# Patient Record
Sex: Female | Born: 1990 | Race: White | Hispanic: No | Smoking: Never smoker
Health system: Southern US, Community
[De-identification: ages and names within clinical notes are randomized; demographics above are authoritative.]

## PROBLEM LIST (undated history)

## (undated) DIAGNOSIS — N390 Urinary tract infection, site not specified: Secondary | ICD-10-CM

## (undated) DIAGNOSIS — F909 Attention-deficit hyperactivity disorder, unspecified type: Secondary | ICD-10-CM

## (undated) DIAGNOSIS — N189 Chronic kidney disease, unspecified: Secondary | ICD-10-CM

## (undated) DIAGNOSIS — N200 Calculus of kidney: Secondary | ICD-10-CM

## (undated) HISTORY — PX: WISDOM TOOTH EXTRACTION: SHX21

## (undated) HISTORY — DX: Urinary tract infection, site not specified: N39.0

## (undated) HISTORY — DX: Calculus of kidney: N20.0

## (undated) HISTORY — DX: Attention-deficit hyperactivity disorder, unspecified type: F90.9

## (undated) HISTORY — DX: Chronic kidney disease, unspecified: N18.9

## (undated) HISTORY — PX: OTHER SURGICAL HISTORY: SHX169

---

## 2012-07-14 ENCOUNTER — Emergency Department: Payer: Self-pay | Admitting: Emergency Medicine

## 2012-07-14 LAB — COMPREHENSIVE METABOLIC PANEL
Albumin: 4.5 g/dL (ref 3.4–5.0)
Alkaline Phosphatase: 63 U/L (ref 50–136)
Anion Gap: 8 (ref 7–16)
BUN: 11 mg/dL (ref 7–18)
Bilirubin,Total: 0.4 mg/dL (ref 0.2–1.0)
Calcium, Total: 8.9 mg/dL (ref 8.5–10.1)
Chloride: 108 mmol/L — ABNORMAL HIGH (ref 98–107)
Co2: 25 mmol/L (ref 21–32)
Creatinine: 0.75 mg/dL (ref 0.60–1.30)
EGFR (African American): >60
Osmolality: 281 (ref 275–301)
Potassium: 3.8 mmol/L (ref 3.5–5.1)
Sodium: 141 mmol/L (ref 136–145)
Total Protein: 7.9 g/dL (ref 6.4–8.2)

## 2012-07-14 LAB — URINALYSIS, COMPLETE
Bacteria: NONE SEEN
Ketone: NEGATIVE
Leukocyte Esterase: NEGATIVE
Nitrite: NEGATIVE
Ph: 6 (ref 4.5–8.0)
Protein: 30
Squamous Epithelial: 2

## 2012-07-14 LAB — DIFFERENTIAL
Basophil #: 0.1 10*3/uL (ref 0.0–0.1)
Basophil %: 0.4 %
Eosinophil #: 0 10*3/uL (ref 0.0–0.7)
Eosinophil %: 0.2 %
Lymphocyte #: 1 10*3/uL (ref 1.0–3.6)
Lymphocyte %: 6.8 %
Monocyte #: 0.4 x10 3/mm (ref 0.2–0.9)
Monocyte %: 2.8 %
Neutrophil #: 13.3 10*3/uL — ABNORMAL HIGH (ref 1.4–6.5)

## 2012-07-14 LAB — CBC
MCHC: 34.6 g/dL (ref 32.0–36.0)
MCV: 89 fL (ref 80–100)
Platelet: 190 10*3/uL (ref 150–440)
RBC: 4.2 10*6/uL (ref 3.80–5.20)
WBC: 14.8 10*3/uL — ABNORMAL HIGH (ref 3.6–11.0)

## 2012-07-14 LAB — LIPASE, BLOOD: Lipase: 83 U/L (ref 73–393)

## 2017-12-02 ENCOUNTER — Encounter: Payer: Self-pay | Admitting: Certified Nurse Midwife

## 2017-12-02 ENCOUNTER — Ambulatory Visit: Payer: BC Managed Care – PPO | Admitting: Certified Nurse Midwife

## 2017-12-02 VITALS — BP 92/55 | HR 51 | Ht 67.0 in | Wt 140.6 lb

## 2017-12-02 DIAGNOSIS — N3001 Acute cystitis with hematuria: Secondary | ICD-10-CM | POA: Diagnosis not present

## 2017-12-02 MED ORDER — NITROFURANTOIN MONOHYD MACRO 100 MG PO CAPS: 100.0000 mg | ORAL_CAPSULE | Freq: Two times a day (BID) | ORAL | 0 refills | Status: AC

## 2017-12-02 NOTE — Progress Notes (Signed)
Pt is here with c/o urgency, burning and freq. Was treated with MacroBid x5. Was using Cipro before the culture was back.

## 2017-12-02 NOTE — Progress Notes (Signed)
GYN ENCOUNTER NOTE  Subjective:       Susan Cooper is a 27 y.o. No obstetric history on file. female is here for gynecologic evaluation of the following issues:  1. UTI, she was treated [facility]TI. She states she gets them all the time after intercourse. She states she was started on Cipro then she was changed to Macrobid x 5 days.   She complains of frequency, urgency, and burning. She denies fever.     Obstetric History OB History  No data available    History reviewed. No pertinent past medical history.  History reviewed. No pertinent surgical history.  Current Outpatient Medications on File Prior to Visit  Medication Sig Dispense Refill  . Levonorgestrel (SKYLA) 13.5 MG IUD by Intrauterine route.     No current facility-administered medications on file prior to visit.     No Known Allergies  Social History   Socioeconomic History  . Marital status: Single    Spouse name: Not on file  . Number of children: Not on file  . Years of education: Not on file  . Highest education level: Not on file  Social Needs  . Financial resource strain: Not on file  . Food insecurity - worry: Not on file  . Food insecurity - inability: Not on file  . Transportation needs - medical: Not on file  . Transportation needs - non-medical: Not on file  Occupational History  . Not on file  Tobacco Use  . Smoking status: Not on file  Substance and Sexual Activity  . Alcohol use: Not on file  . Drug use: Not on file  . Sexual activity: Not on file  Other Topics Concern  . Not on file  Social History Narrative  . Not on file    History reviewed. No pertinent family history.  The following portions of the patient's history were reviewed and updated as appropriate: allergies, current medications, past family history, past medical history, past social history, past surgical history and problem list.  Review of Systems Review of Systems - Negative except as metioned  in HPI Review of Systems - General ROS: negative for - chills, fatigue, fever, hot flashes, malaise or night sweats Hematological and Lymphatic ROS: negative for - bleeding problems or swollen lymph nodes Gastrointestinal ROS: negative for - abdominal pain, blood in stools, change in bowel habits and nausea/vomiting Musculoskeletal ROS: negative for - joint pain, muscle pain or muscular weakness Genito-Urinary ROS: negative for - change in menstrual cycle, dysmenorrhea, dyspareunia, genital discharge, genital ulcers,  incontinence, irregular/heavy menses, nocturia or pelvic pain. Positive: dysuria, hematuria, frequency, burning and urgency.  Objective:   BP (!) 92/55   Pulse (!) 51   Ht 5\' 7"  (1.702 m)   Wt 140 lb 9 oz (63.8 kg)   LMP 11/22/2017 (Exact Date)   BMI 22.02 kg/m  Temp 98.0 CONSTITUTIONAL: Well-developed, well-nourished female in no acute distress.  HENT:  Normocephalic, atraumatic.  NECK: Normal range of motion, supple,  SKIN: Skin is warm and dry. No rash noted. Not diaphoretic. No erythema. No pallor. [place]: Alert and oriented to person, place, and time.  PSYCHIATRIC: Normal mood and affect. Normal behavior. Normal judgment and thought content. CARDIOVASCULAR:Not Examined RESPIRATORY:Clear bilaterally  BREASTS: Not Examined Kidneys: negative CVA tenderness ABDOMEN: Soft, non distended; Non tender.  No Organomegaly. PELVIC:not indicated MUSCULOSKELETAL: Normal range of motion. No tenderness.  No cyanosis, clubbing, or edema.   Assessment:   Urinary Frequency  Plan:   Urine culture, order for Macrobid. Encourage PO hydration, cranberry juice, AZO and good perineal hygiene. Encourage emptying bladder after intercourse. Discussed use of continuous antimicrobial prophylaxis regimens for women with recurrent urinary tract infection. She verbalizes understanding and agrees to plan.    Philip Aspen, CNM

## 2017-12-02 NOTE — Patient Instructions (Signed)
Acute Urinary Retention, Female Urinary retention means you are unable to pee completely or at all (empty your bladder). Follow these instructions at home:  Drink enough fluids to keep your pee (urine) clear or pale yellow.  If you are sent home with a tube that drains the bladder (catheter), there will be a drainage bag attached to it. There are two types of bags. One is big that you can wear at night without having to empty it. One is smaller and needs to be emptied more often.  Keep the drainage bag emptied.  Keep the drainage bag lower than the tube.  Only take medicine as told by your doctor. Contact a doctor if:  You have a low-grade fever.  You have spasms or you are leaking pee when you have spasms. Get help right away if:  You have chills or a fever.  Your catheter stops draining pee.  Your catheter falls out.  You have increased bleeding that does not stop after you have rested and increased the amount of fluids you had been drinking. This information is not intended to replace advice given to you by your health care provider. Make sure you discuss any questions you have with your health care provider. Document Released: 03/12/2008 Document Revised: 03/01/2016 Document Reviewed: 03/05/2013 Elsevier Interactive Patient Education  2017 Elsevier Inc.  

## 2017-12-04 ENCOUNTER — Telehealth: Payer: Self-pay

## 2017-12-04 ENCOUNTER — Other Ambulatory Visit: Payer: Self-pay | Admitting: Certified Nurse Midwife

## 2017-12-04 DIAGNOSIS — N390 Urinary tract infection, site not specified: Secondary | ICD-10-CM

## 2017-12-04 LAB — URINE CULTURE

## 2017-12-04 NOTE — Progress Notes (Signed)
Order placed to urology per pt request for recurrent UTI's  Philip Aspen, CNM

## 2017-12-04 NOTE — Telephone Encounter (Signed)
Message left with providers instructions.

## 2017-12-05 ENCOUNTER — Telehealth: Payer: Self-pay

## 2017-12-05 NOTE — Telephone Encounter (Signed)
Spoke with pt- she received the previous message and she has an appointment scheduled with urologist next week.

## 2017-12-11 ENCOUNTER — Ambulatory Visit: Payer: BC Managed Care – PPO | Admitting: Urology

## 2017-12-12 ENCOUNTER — Ambulatory Visit: Payer: BC Managed Care – PPO | Admitting: Urology

## 2017-12-12 ENCOUNTER — Encounter: Payer: Self-pay | Admitting: Urology

## 2017-12-12 VITALS — BP 103/69 | HR 58 | Ht 67.0 in | Wt 135.0 lb

## 2017-12-12 DIAGNOSIS — N390 Urinary tract infection, site not specified: Secondary | ICD-10-CM

## 2017-12-12 DIAGNOSIS — Z87442 Personal history of urinary calculi: Secondary | ICD-10-CM | POA: Diagnosis not present

## 2017-12-12 MED ORDER — CEPHALEXIN 250 MG PO CAPS: ORAL_CAPSULE | ORAL | 0 refills | Status: DC

## 2017-12-12 NOTE — Progress Notes (Signed)
12/12/2017 9:54 AM   Angus Seller 1990-12-21 [id number]  Referring provider: Philip Aspen, CNM [address] [place], [place] [address]  Chief Complaint  Patient presents with  . New Patient (Initial Visit)    HPI: Patient is a 27 -year-old Caucasian female who is referred to [place] by Philip Aspen, CNM, for recurrent urinary tract infections.  She states for the last 2-3 years she has been experiencing UTIs over and over again.    Reviewing her records,  she has had no documented positive urine cultures.  She generally seeks treatment through urgent care centers and they typically do not culture urine's.  Her symptoms with a urinary tract infection consist of dysuria and urgency.  She has been told from time to time that she had blood in her urine, but she denies any gross hematuria.  She has had noted some intermittent flank pain.  She denies gross hematuria, suprapubic pain, back pain, abdominal pain or flank pain associated with UTI's.  She has not had any recent fevers, chills, nausea or vomiting associated with UTI's.   She has a history of spontaneous passage of a stone.  She does not have a history of GU surgery or GU trauma.  She is sexually active.  She has noted a correlation with her urinary tract infections and sexual intercourse.   She does engage in anal sex.  She is not having anal to vaginal sex.  She is voiding before and after sex.   She denies constipation and/or diarrhea.  She does use tampons, but she is changing them regularly.  She does engage in good perineal hygiene. She does not take tub baths.   She does not have incontinence.  She is not having pain with bladder filling.    She has not had any recent imaging studies.    She mostly drinks coffee and energy drinks.  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    Her CATH UA today is negative.  Her PVR was minimal.    PMH: Past Medical History:  Diagnosis Date  . Chronic  kidney disease   . Kidney stone   . Urinary tract infection     Surgical History: No past surgical history on file.  Home Medications:  Allergies as of 12/12/2017   No Known Allergies     Medication List        Accurate as of 12/12/17 11:59 PM. Always use your most recent med list.          cephALEXin 250 MG capsule Commonly known as:  KEFLEX Take one capsule after intercourse   nitrofurantoin (macrocrystal-monohydrate) 100 MG capsule Commonly known as:  MACROBID Take 1 capsule (100 mg total) by mouth 2 (two) times daily for 10 days.   SKYLA 13.5 MG Iud Generic drug:  Levonorgestrel by Intrauterine route.       Allergies: No Known Allergies  Family History: Family History  Problem Relation Age of Onset  . Hematuria Paternal Grandmother   . Kidney failure Paternal Grandmother   . Kidney disease Paternal Grandfather   . Kidney cancer Neg Hx   . Prostate cancer Neg Hx     Social History:  reports that she has never smoked. She has never used smokeless tobacco. She reports that she drinks alcohol. She reports that she does not use drugs.  ROS: UROLOGY Frequent Urination?: Yes Hard to postpone urination?: Yes Burning/pain with urination?: Yes Get up at night to  urinate?: No Leakage of urine?: No Urine stream starts and stops?: No Trouble starting stream?: No Do you have to strain to urinate?: No Blood in urine?: Yes Urinary tract infection?: Yes Sexually transmitted disease?: No Injury to kidneys or bladder?: No Painful intercourse?: No Weak stream?: No Currently pregnant?: No Vaginal bleeding?: No Last menstrual period?: 11/22/17  Gastrointestinal Nausea?: No Vomiting?: No Indigestion/heartburn?: No Diarrhea?: No Constipation?: No  Constitutional Fever: No Night sweats?: No Weight loss?: No Fatigue?: No  Skin Skin rash/lesions?: No Itching?: No  Eyes Blurred vision?: No Double vision?: No  Ears/Nose/Throat Sore throat?: No Sinus  problems?: No  Hematologic/Lymphatic Swollen glands?: No Easy bruising?: No  Cardiovascular Leg swelling?: No Chest pain?: No  Respiratory Cough?: No Shortness of breath?: No  Endocrine Excessive thirst?: No  Musculoskeletal Back pain?: No Joint pain?: No  Neurological Headaches?: No Dizziness?: No  Psychologic Depression?: No Anxiety?: No  Physical Exam: BP 103/69   Pulse (!) 58   Ht 5' 7" (1.702 m)   Wt 135 lb (61.2 kg)   LMP 11/22/2017 (Exact Date)   BMI 21.14 kg/m   Constitutional: Well nourished. Alert and oriented, No acute distress. HEENT: [place] AT, moist mucus membranes. Trachea midline, no masses. Cardiovascular: No clubbing, cyanosis, or edema. Respiratory: Normal respiratory effort, no increased work of breathing. GI: Abdomen is soft, non tender, non distended, no abdominal masses. Liver and spleen not palpable.  No hernias appreciated.  Stool sample for occult testing is not indicated.   GU: No CVA tenderness.  No bladder fullness or masses.  Normal external genitalia, normal pubic hair distribution, no lesions.  Normal urethral meatus, no lesions, no prolapse, no discharge.   No urethral masses, tenderness and/or tenderness. No bladder fullness, tenderness or masses. Normal vagina mucosa, good estrogen effect, no discharge, no lesions, good pelvic support, no cystocele or rectocele noted.  No cervical motion tenderness.  Uterus is freely mobile and non-fixed.  No adnexal/parametria masses or tenderness noted.  Anus and perineum are without rashes or lesions.    Skin: No rashes, bruises or suspicious lesions. Lymph: No cervical or inguinal adenopathy. Neurologic: Grossly intact, no focal deficits, moving all 4 extremities. Psychiatric: Normal mood and affect.  Laboratory Data: Lab Results  Component Value Date   WBC 14.8 (H) 07/14/2012   HGB 13.0 07/14/2012   HCT 37.5 07/14/2012   MCV 89 07/14/2012   PLT 190 07/14/2012    Lab Results  Component  Value Date   CREATININE 0.75 07/14/2012    No results found for: PSA  No results found for: TESTOSTERONE  No results found for: HGBA1C  No results found for: TSH  No results found for: CHOL, HDL, CHOLHDL, VLDL, LDLCALC  Lab Results  Component Value Date   AST 23 07/14/2012   Lab Results  Component Value Date   ALT 23 07/14/2012   No components found for: ALKALINEPHOPHATASE No components found for: BILIRUBINTOTAL  No results found for: ESTRADIOL  Urinalysis Negative.  See Epic.  I have reviewed the labs.  Assessment & Plan:    1. Recurrent UTI  - criteria for recurrent UTI has not been met with 2 or more documented infections in 6 months or 3 or greater infections in one year - seeks treatment through urgent cares  - patient is instructed to increase their water intake until the urine is pale yellow or clear (10 to 12 cups daily) - try to limit coffee and energy drink  - patient is instructed to take   probiotics (yogurt, oral pills or vaginal suppositories), take cranberry pills or drink the juice and Vitamin C 1,000 mg daily to acidify the urine  - if using tampons, she should remove them prior to urinating and change them often  - avoid soaking in tubs and wipe front to back after urinating   - advised them to have CATH UA's for urinalysis and culture to prevent skin, vaginal and/or rectal contamination of the specimen  - reviewed symptoms of UTI (fevers, chills, gross hematuria, mental status changes, dysuria, suprapubic pain, back pain and/or sudden worsening of urinary symptoms) and advised not to have urine checked or be treated for UTI if not experiencing symptoms  - discussed antibiotic stewardship with the patient - explained the risk of increasing risk of antibiotic resistance with continuous exposure to antibiotics, renal failure, hypoglycemia, C. Diff infection, allergic reactions, etc.                           - Urinalysis, Complete - CATH UA negative  -Start  post coital Keflex 250 mg to take daily as she states her and her husband have intercourse several times a week  -RTC in 3 months for recheck and sooner if she experiences any breakthrough infections  2.  History of nephrolithiasis We will obtain renal ultrasound to evaluate for any possible stones acting as a nidus for her recurrent UTIs   Return in about 3 months (around 03/14/2018) for PVR and OAB questionnaire.  These notes generated with voice recognition software. I apologize for typographical errors.  Zara Council, [facility] [address] [place], [place] [address] [phone]

## 2017-12-13 LAB — URINALYSIS, COMPLETE
Bilirubin, UA: NEGATIVE
Glucose, UA: NEGATIVE
Ketones, UA: NEGATIVE
Leukocytes, UA: NEGATIVE
Nitrite, UA: NEGATIVE
Protein, UA: NEGATIVE
Specific Gravity, UA: 1.015 (ref 1.005–1.030)
Urobilinogen, Ur: 0.2 mg/dL (ref 0.2–1.0)
pH, UA: 7.5 (ref 5.0–7.5)

## 2017-12-13 LAB — MICROSCOPIC EXAMINATION: Bacteria, UA: NONE SEEN

## 2017-12-23 ENCOUNTER — Ambulatory Visit
Admission: RE | Admit: 2017-12-23 | Discharge: 2017-12-23 | Disposition: A | Discharge status: Home / Self Care | Payer: BC Managed Care – PPO | Source: Ambulatory Visit | Attending: Urology | Admitting: Urology

## 2017-12-23 DIAGNOSIS — N390 Urinary tract infection, site not specified: Secondary | ICD-10-CM | POA: Diagnosis present

## 2017-12-23 DIAGNOSIS — Z87442 Personal history of urinary calculi: Secondary | ICD-10-CM | POA: Insufficient documentation

## 2018-01-02 ENCOUNTER — Telehealth: Payer: Self-pay

## 2018-01-02 NOTE — Telephone Encounter (Signed)
-----   Message from Nori Riis, PA-C sent at 01/02/2018 10:17 AM EDT ----- Please let Susan Cooper know that her renal ultrasound showed a tiny stone in her right kidney.  This is not causing her recurrent UTI's and we do not need to treat it at this time.  We will see her on June 10th.

## 2018-01-02 NOTE — Telephone Encounter (Signed)
Spoke with pt in reference to RUS results. Pt voiced understanding.  

## 2018-01-02 NOTE — Telephone Encounter (Signed)
Left pt mess to call 

## 2018-03-12 ENCOUNTER — Encounter: Payer: Self-pay | Admitting: Certified Nurse Midwife

## 2018-03-12 ENCOUNTER — Ambulatory Visit: Payer: BC Managed Care – PPO | Admitting: Certified Nurse Midwife

## 2018-03-12 VITALS — BP 107/62 | HR 65 | Ht 67.0 in | Wt 138.4 lb

## 2018-03-12 DIAGNOSIS — R5383 Other fatigue: Secondary | ICD-10-CM | POA: Diagnosis not present

## 2018-03-12 NOTE — Patient Instructions (Signed)
Thyroid-Stimulating Hormone Test Why am I having this test? A thyroid-stimulating hormone (TSH) test is a blood test that is done to measure the level of TSH, also known as thyrotropin, in your blood. TSH is produced by the pituitary gland. The pituitary gland is a small organ located just below the brain, behind your eyes and nasal passages. It is part of a system that monitors and maintains thyroid hormone levels and thyroid gland function. Thyroid hormones affect many body parts and systems, including the system that affects how quickly your body burns fuel for energy. Your health care provider may recommend testing your TSH level if you have signs and symptoms of abnormal thyroid hormone levels. Knowing the level of TSH in your blood can help your health care provider:  Diagnose a thyroid gland or pituitary gland disorder.  Manage your condition and treatment if you have hypothyroidism or hyperthyroidism.  What kind of sample is taken? A blood sample is required for this test. It is usually collected by inserting a needle into a vein. How do I prepare for this test? There is no preparation required for this test. What are the reference ranges? Reference rangesare considered healthy rangesestablished after testing a large group of healthy people. Reference rangesmay vary among different people, labs, and hospitals. It is your responsibility to obtain your test results. Ask the lab or department performing the test when and how you will get your results. Range of Normal Values:  Adult: 0.3-5 microunits/mL or 0.3-5 milliunits/L (SI units).  Newborn: 3-18 microunits/mL or 3-18 milliunits/L.  Cord: 3-12 microunits/mL or 3-12 milliunits/L.  What do the results mean? A high level of TSH may mean:  Your thyroid gland is not making enough thyroid hormones. When the thyroid gland does not make enough thyroid hormones, the pituitary gland releases TSH into the bloodstream. The  higher-than-normal levels of TSH prompt the thyroid gland to release more thyroid hormones.  You are getting an insufficient level of thyroid hormone medicine, if you are receiving this type of treatment.  There is a problem with the pituitary gland (rare).  A low level of TSH can indicate a problem with the pituitary gland. Talk with your health care provider to discuss your results, treatment options, and if necessary, the need for more tests. Talk with your health care provider if you have any questions about your results. Talk with your health care provider to discuss your results, treatment options, and if necessary, the need for more tests. Talk with your health care provider if you have any questions about your results. This information is not intended to replace advice given to you by your health care provider. Make sure you discuss any questions you have with your health care provider. Document Released: 10/19/2004 Document Revised: 05/27/2016 Document Reviewed: 02/17/2014 Elsevier Interactive Patient Education  2018 Elsevier Inc.  

## 2018-03-12 NOTE — Progress Notes (Signed)
Pt is here requesting a thyroid check. C/o fatigue.

## 2018-03-12 NOTE — Progress Notes (Signed)
GYN ENCOUNTER NOTE  Subjective:       Susan Cooper is a 27 y.o. No obstetric history on file. female is here for gynecologic evaluation of the following issues: 1. Fatigue  2.. Difficulty losing weight  3. Patient states her family history,mother and grandmother with thyroid issues.    Gynecologic History Patient's last menstrual period was 03/12/2018 (exact date). Contraception: IUD   Obstetric History OB History  No data available    Past Medical History:  Diagnosis Date  . Chronic kidney disease   . Kidney stone   . Urinary tract infection     No past surgical history on file.  Current Outpatient Medications on File Prior to Visit  Medication Sig Dispense Refill  . cephALEXin (KEFLEX) 250 MG capsule Take one capsule after intercourse 90 capsule 0  . Levonorgestrel (SKYLA) 13.5 MG IUD by Intrauterine route.    Marland Kitchen ibuprofen (ADVIL,MOTRIN) 200 MG tablet Take by mouth.     No current facility-administered medications on file prior to visit.     No Known Allergies  Social History   Socioeconomic History  . Marital status: Married    Spouse name: Not on file  . Number of children: Not on file  . Years of education: Not on file  . Highest education level: Not on file  Occupational History  . Not on file  Social Needs  . Financial resource strain: Not on file  . Food insecurity:    Worry: Not on file    Inability: Not on file  . Transportation needs:    Medical: Not on file    Non-medical: Not on file  Tobacco Use  . Smoking status: Never Smoker  . Smokeless tobacco: Never Used  Substance and Sexual Activity  . Alcohol use: Yes  . Drug use: No  . Sexual activity: Yes  Lifestyle  . Physical activity:    Days per week: Not on file    Minutes per session: Not on file  . Stress: Not on file  Relationships  . Social connections:    Talks on phone: Not on file    Gets together: Not on file    Attends religious service: Not on file    Active member of  club or organization: Not on file    Attends meetings of clubs or organizations: Not on file    Relationship status: Not on file  . Intimate partner violence:    Fear of current or ex partner: Not on file    Emotionally abused: Not on file    Physically abused: Not on file    Forced sexual activity: Not on file  Other Topics Concern  . Not on file  Social History Narrative  . Not on file    Family History  Problem Relation Age of Onset  . Hematuria Paternal Grandmother   . Kidney failure Paternal Grandmother   . Kidney disease Paternal Grandfather   . Kidney cancer Neg Hx   . Prostate cancer Neg Hx     The following portions of the patient's history were reviewed and updated as appropriate: allergies, current medications, past family history, past medical history, past social history, past surgical history and problem list.  Review of Systems Review of Systems - General ROS: negative for - chills, fatigue, fever, hot flashes, malaise or night sweats Hematological and Lymphatic ROS: negative for - bleeding problems or swollen lymph nodes Gastrointestinal ROS: negative for - abdominal pain, blood in stools, change in bowel habits  and nausea/vomiting Musculoskeletal ROS: negative for - joint pain, muscle pain or muscular weakness Genito-Urinary ROS: negative for - change in menstrual cycle, dysmenorrhea, dyspareunia, dysuria, genital discharge, genital ulcers, hematuria, incontinence, irregular/heavy menses, nocturia or pelvic painjj  Objective:   BP 107/62   Pulse 65   Ht 5\' 7"  (1.702 m)   Wt 138 lb 6 oz (62.8 kg)   LMP 03/12/2018 (Exact Date)   BMI 21.67 kg/m  CONSTITUTIONAL: Well-developed, well-nourished female in no acute distress.  HENT:  Normocephalic, atraumatic.  NECK: Normal range of motion, supple, no masses.  Normal thyroid.  SKIN: Skin is warm and dry. No rash noted. Not diaphoretic. No erythema. No pallor. [place]: Alert and oriented to person, place, and  time. PSYCHIATRIC: Normal mood and affect. Normal behavior. Normal judgment and thought content. CARDIOVASCULAR:RRR RESPIRATORY: Clear bilaterally BREASTS: Not Examined ABDOMEN: Soft, non distended; Non tender.  No Organomegaly. PELVIC: NOT indicated MUSCULOSKELETAL: Normal range of motion. No tenderness.  No cyanosis, clubbing, or edema.     Assessment:   Fatigue     Plan:  Thyroid Panel CBC Vitamin D level Will follow with results. Return prn  Shanika Creacy,SNM/Mindee Robledo,CNM

## 2018-03-17 ENCOUNTER — Ambulatory Visit: Payer: BC Managed Care – PPO | Admitting: Urology

## 2018-03-17 ENCOUNTER — Encounter (INDEPENDENT_AMBULATORY_CARE_PROVIDER_SITE_OTHER): Payer: Self-pay

## 2018-03-17 ENCOUNTER — Encounter: Payer: Self-pay | Admitting: Urology

## 2018-03-17 LAB — CBC
Hematocrit: 39.1 % (ref 34.0–46.6)
Hemoglobin: 12.5 g/dL (ref 11.1–15.9)
MCH: 29.2 pg (ref 26.6–33.0)
MCHC: 32 g/dL (ref 31.5–35.7)
MCV: 91 fL (ref 79–97)
PLATELETS: 273 10*3/uL (ref 150–450)
RBC: 4.28 x10E6/uL (ref 3.77–5.28)
RDW: 13.2 % (ref 12.3–15.4)
WBC: 8.7 10*3/uL (ref 3.4–10.8)

## 2018-03-17 LAB — THYROID PANEL WITH TSH
FREE THYROXINE INDEX: 2.1 (ref 1.2–4.9)
T3 UPTAKE RATIO: 32 % (ref 24–39)
T4, Total: 6.6 ug/dL (ref 4.5–12.0)
TSH: 1.08 u[IU]/mL (ref 0.450–4.500)

## 2018-03-17 LAB — VITAMIN D 1,25 DIHYDROXY
Vitamin D 1, 25 (OH)2 Total: 51 pg/mL
Vitamin D3 1, 25 (OH)2: 51 pg/mL

## 2018-06-18 ENCOUNTER — Telehealth: Payer: Self-pay | Admitting: Certified Nurse Midwife

## 2018-06-18 NOTE — Telephone Encounter (Signed)
Error

## 2018-06-23 ENCOUNTER — Ambulatory Visit (INDEPENDENT_AMBULATORY_CARE_PROVIDER_SITE_OTHER): Payer: BC Managed Care – PPO | Admitting: Certified Nurse Midwife

## 2018-06-23 ENCOUNTER — Encounter: Payer: Self-pay | Admitting: Certified Nurse Midwife

## 2018-06-23 VITALS — BP 95/60 | HR 63 | Ht 67.0 in | Wt 141.1 lb

## 2018-06-23 DIAGNOSIS — Z30432 Encounter for removal of intrauterine contraceptive device: Secondary | ICD-10-CM

## 2018-06-23 DIAGNOSIS — N390 Urinary tract infection, site not specified: Secondary | ICD-10-CM

## 2018-06-23 MED ORDER — NORETHIN ACE-ETH ESTRAD-FE 1-20 MG-MCG PO TABS: 1.0000 | ORAL_TABLET | Freq: Every day | ORAL | 11 refills | Status: DC

## 2018-06-23 MED ORDER — CEPHALEXIN 250 MG PO CAPS: ORAL_CAPSULE | ORAL | 0 refills | Status: DC

## 2018-06-23 NOTE — Patient Instructions (Signed)
Oral Contraception Use Oral contraceptive pills (OCPs) are medicines taken to prevent pregnancy. OCPs work by preventing the ovaries from releasing eggs. The hormones in OCPs also cause the cervical mucus to thicken, preventing the sperm from entering the uterus. The hormones also cause the uterine lining to become thin, not allowing a fertilized egg to attach to the inside of the uterus. OCPs are highly effective when taken exactly as prescribed. However, OCPs do not prevent sexually transmitted diseases (STDs). Safe sex practices, such as using condoms along with an OCP, can help prevent STDs. Before taking OCPs, you may have a physical exam and Pap test. Your health care provider may also order blood tests if necessary. Your health care provider will make sure you are a good candidate for oral contraception. Discuss with your health care provider the possible side effects of the OCP you may be prescribed. When starting an OCP, it can take 2 to 3 months for the body to adjust to the changes in hormone levels in your body. How to take oral contraceptive pills Your health care provider may advise you on how to start taking the first cycle of OCPs. Otherwise, you can:  Start on day 1 of your menstrual period. You will not need any backup contraceptive protection with this start time.  Start on the first Sunday after your menstrual period or the day you get your prescription. In these cases, you will need to use backup contraceptive protection for the first week.  Start the pill at any time of your cycle. If you take the pill within 5 days of the start of your period, you are protected against pregnancy right away. In this case, you will not need a backup form of birth control. If you start at any other time of your menstrual cycle, you will need to use another form of birth control for 7 days. If your OCP is the type called a minipill, it will protect you from pregnancy after taking it for 2 days (48  hours).  After you have started taking OCPs:  If you forget to take 1 pill, take it as soon as you remember. Take the next pill at the regular time.  If you miss 2 or more pills, call your health care provider because different pills have different instructions for missed doses. Use backup birth control until your next menstrual period starts.  If you use a 28-day pack that contains inactive pills and you miss 1 of the last 7 pills (pills with no hormones), it will not matter. Throw away the rest of the non-hormone pills and start a new pill pack.  No matter which day you start the OCP, you will always start a new pack on that same day of the week. Have an extra pack of OCPs and a backup contraceptive method available in case you miss some pills or lose your OCP pack. Follow these instructions at home:  Do not smoke.  Always use a condom to protect against STDs. OCPs do not protect against STDs.  Use a calendar to mark your menstrual period days.  Read the information and directions that came with your OCP. Talk to your health care provider if you have questions. Contact a health care provider if:  You develop nausea and vomiting.  You have abnormal vaginal discharge or bleeding.  You develop a rash.  You miss your menstrual period.  You are losing your hair.  You need treatment for mood swings or depression.  You   get dizzy when taking the OCP.  You develop acne from taking the OCP.  You become pregnant. Get help right away if:  You develop chest pain.  You develop shortness of breath.  You have an uncontrolled or severe headache.  You develop numbness or slurred speech.  You develop visual problems.  You develop pain, redness, and swelling in the legs. This information is not intended to replace advice given to you by your health care provider. Make sure you discuss any questions you have with your health care provider. Document Released: 09/13/2011 Document  Revised: 03/01/2016 Document Reviewed: 03/15/2013 Elsevier Interactive Patient Education  2017 Elsevier Inc.  

## 2018-06-23 NOTE — Progress Notes (Signed)
  GYNECOLOGY OFFICE PROCEDURE NOTE  Susan Cooper is a 27 y.o. G0P0000 here for [facility] IUD removal. No GYN concerns.    IUD Removal  Patient identified, informed consent performed, consent signed.  Patient was in the dorsal lithotomy position, normal external genitalia was noted.  A speculum was placed in the patient's vagina, normal discharge was noted, no lesions. The cervix was visualized, no lesions, no abnormal discharge.  The strings of the IUD were grasped and pulled using ring forceps. The IUD was removed in its entirety.   Patient tolerated the procedure well.    Patient will use OCP's for contraception.   Routine preventative health maintenance measures emphasized. Discussed use of antibiotics and OCP's. She verbalizes and agrees to plan. She denies any contraindications to OCP's.  Follow up PRN.   Philip Aspen, CNM

## 2018-11-13 ENCOUNTER — Ambulatory Visit: Payer: BC Managed Care – PPO | Admitting: Certified Nurse Midwife

## 2018-11-13 ENCOUNTER — Ambulatory Visit: Payer: BC Managed Care – PPO | Admitting: Urology

## 2018-11-13 ENCOUNTER — Encounter: Payer: Self-pay | Admitting: Certified Nurse Midwife

## 2018-11-13 VITALS — BP 134/61 | HR 52 | Ht 67.0 in | Wt 147.0 lb

## 2018-11-13 DIAGNOSIS — J069 Acute upper respiratory infection, unspecified: Secondary | ICD-10-CM

## 2018-11-13 DIAGNOSIS — N3001 Acute cystitis with hematuria: Secondary | ICD-10-CM | POA: Diagnosis not present

## 2018-11-13 DIAGNOSIS — Z20828 Contact with and (suspected) exposure to other viral communicable diseases: Secondary | ICD-10-CM

## 2018-11-13 DIAGNOSIS — N390 Urinary tract infection, site not specified: Secondary | ICD-10-CM

## 2018-11-13 DIAGNOSIS — N200 Calculus of kidney: Secondary | ICD-10-CM | POA: Insufficient documentation

## 2018-11-13 LAB — POCT URINALYSIS DIPSTICK
Bilirubin, UA: NEGATIVE
Glucose, UA: NEGATIVE
Ketones, UA: NEGATIVE
LEUKOCYTES UA: NEGATIVE
Nitrite, UA: NEGATIVE
Protein, UA: POSITIVE — AB
Spec Grav, UA: 1.01 (ref 1.010–1.025)
Urobilinogen, UA: 0.2 E.U./dL
pH, UA: 6.5 (ref 5.0–8.0)

## 2018-11-13 LAB — POCT INFLUENZA A/B
Influenza A, POC: NEGATIVE
Influenza B, POC: NEGATIVE

## 2018-11-13 MED ORDER — CEPHALEXIN 250 MG PO CAPS: ORAL_CAPSULE | ORAL | 3 refills | Status: DC

## 2018-11-13 MED ORDER — CEPHALEXIN 500 MG PO CAPS: 500.0000 mg | ORAL_CAPSULE | Freq: Four times a day (QID) | ORAL | 0 refills | Status: DC

## 2018-11-13 MED ORDER — PHENAZOPYRIDINE HCL 200 MG PO TABS: 200.0000 mg | ORAL_TABLET | Freq: Three times a day (TID) | ORAL | 1 refills | Status: DC | PRN

## 2018-11-13 NOTE — Progress Notes (Signed)
GYN ENCOUNTER NOTE  Subjective:       Susan Cooper is a 28 y.o. G0P0000 female is here for gynecologic evaluation of the following issues:  1. Flank pain 2. Dysuria  Reports current bilateral kidney stones and history of frequent UTIs. Reports the flank pain as "achy". Endorses symptoms are consistent with previous kidney infections. Reports she was taking prophylactic keflex after intercourse, but ran out of the prescription before her last sexual encounter.   Endorses fever of 102.1 relieved with ibuprofen. Endorses general malaise, cough, sore throat, and ear pressure. Symptoms started 11/11/2018.  Denies difficulty breathing, respiratory distress, chest pain, palpitations, change in bowel habits, leg pain, or leg swelling.      Gynecologic History  Patient's last menstrual period was 11/11/2018 (exact date).  Period Cycle (Days): 28 Period Duration (Days): 7 Period Pattern: Regular Menstrual Flow: Heavy Menstrual Control: Tampon Dysmenorrhea: (!) Severe Dysmenorrhea Symptoms: Cramping  Contraception: none   Last Pap: unknown  Obstetric History  OB History  Gravida Para Term Preterm AB Living  0 0 0 0 0 0  SAB TAB Ectopic Multiple Live Births  0 0 0 0 0    Past Medical History:  Diagnosis Date  . Chronic kidney disease   . Kidney stone   . Urinary tract infection     No past surgical history on file.  Current Outpatient Medications on File Prior to Visit  Medication Sig Dispense Refill  . ibuprofen (ADVIL,MOTRIN) 200 MG tablet Take by mouth.     No current facility-administered medications on file prior to visit.     No Known Allergies  Social History   Socioeconomic History  . Marital status: Married    Spouse name: Not on file  . Number of children: Not on file  . Years of education: Not on file  . Highest education level: Not on file  Occupational History  . Not on file  Social Needs  . Financial resource strain: Not on file  . Food  insecurity:    Worry: Not on file    Inability: Not on file  . Transportation needs:    Medical: Not on file    Non-medical: Not on file  Tobacco Use  . Smoking status: Never Smoker  . Smokeless tobacco: Never Used  Substance and Sexual Activity  . Alcohol use: Yes    Comment: occasional   . Drug use: No  . Sexual activity: Yes    Birth control/protection: None  Lifestyle  . Physical activity:    Days per week: Not on file    Minutes per session: Not on file  . Stress: Not on file  Relationships  . Social connections:    Talks on phone: Not on file    Gets together: Not on file    Attends religious service: Not on file    Active member of club or organization: Not on file    Attends meetings of clubs or organizations: Not on file    Relationship status: Not on file  . Intimate partner violence:    Fear of current or ex partner: Not on file    Emotionally abused: Not on file    Physically abused: Not on file    Forced sexual activity: Not on file  Other Topics Concern  . Not on file  Social History Narrative  . Not on file    Family History  Problem Relation Age of Onset  . Hematuria Paternal Grandmother   . Kidney failure  Paternal Grandmother   . Kidney disease Paternal Grandfather   . Thyroid disease Mother   . Thyroid disease Maternal Grandmother   . Melanoma Paternal Aunt   . Kidney cancer Neg Hx   . Prostate cancer Neg Hx   . Breast cancer Neg Hx   . Ovarian cancer Neg Hx   . Colon cancer Neg Hx     The following portions of the patient's history were reviewed and updated as appropriate: allergies, current medications, past family history, past medical history, past social history, past surgical history and problem list.  Review of Systems  ROS negative except for above. Information obtained from patient.   Objective:   BP 134/61   Pulse (!) 52   Ht 5\' 7"  (1.702 m)   Wt 147 lb (66.7 kg)   LMP 11/11/2018 (Exact Date)   BMI 23.02 kg/m     CONSTITUTIONAL: Well-developed, well-nourished female in no acute distress.   HENT:  Normocephalic, atraumatic. Preauricular lymphadenopathy noted. Left tympanic membrane bulging.   SKIN: Skin is warm and dry. No rash noted. Not diaphoretic. No erythema. No pallor.  CARDIOVASCULAR: Rhythm regular. No murmurs auscultated.   RESPIRATORY: Clear to auscultation bilaterally.  ABDOMEN: Soft, non distended; Non tender. Bowel sounds present. Left flank pain.  Assessment:   1. Acute cystitis with hematuria - POCT urinalysis dipstick - Urine Culture  2. Exposure to the flu - POCT Influenza A/B  3. Recurrent UTI - cephALEXin (KEFLEX) 250 MG capsule; Take one capsule after intercourse  Dispense: 90 capsule; Refill: 3   4. Upper respiratory infection  Plan:   Continue OTC treatment for URI symptoms.  Discussed UTI prevention measures.   Note given to abstain from [place] this weekend with the [facility]; see Communications.  Flu swab negative.   Rx: Keflex and Pyridium, see orders.   Reviewed red flag symptoms and when to call.   RTC x 3 days if symptoms worsen or fail to improve.   [place] NP Student

## 2018-11-13 NOTE — Progress Notes (Signed)
Patient c/o burning with urination, fever, urgency and lower left back pain x2 days. Patient taking ibuprofen with no relief.  Temp in office 97.9.

## 2018-11-13 NOTE — Patient Instructions (Addendum)
WE WOULD LOVE TO HEAR FROM YOU!!!!   Thank you Susan Cooper for visiting [facility].  Providing our patients with the best experience possible is really important to [place], and we hope that you felt that on your recent visit. The most valuable feedback we get comes from [place]!!    If you receive a survey please take a couple of minutes to let us know how we did.Thank you for continuing to trust [place] with your care.   [facility]   Dysuria Dysuria is pain or discomfort while urinating. The pain or discomfort may be felt in the part of your body that drains urine from the bladder (urethra) or in the surrounding tissue of the genitals. The pain may also be felt in the groin area, lower abdomen, or lower back. You may have to urinate frequently or have the sudden feeling that you have to urinate (urgency). Dysuria can affect both men and women, but it is more common in women. Dysuria can be caused by many different things, including:  Urinary tract infection.  Kidney stones or bladder stones.  Certain sexually transmitted infections (STIs), such as chlamydia.  Dehydration.  Inflammation of the tissues of the vagina.  Use of certain medicines.  Use of certain soaps or scented products that cause irritation. Follow these instructions at home: General instructions  Watch your condition for any changes.  Urinate often. Avoid holding urine for long periods of time.  After a bowel movement or urination, women should cleanse from front to back, using each tissue only once.  Urinate after sexual intercourse.  Keep all follow-up visits as told by your health care provider. This is important.  If you had any tests done to find the cause of dysuria, it is up to you to get your test results. Ask your health care provider, or the department that is doing the test, when your results will be ready. Eating and drinking   Drink enough fluid to keep your  urine pale yellow.  Avoid caffeine, tea, and alcohol. They can irritate the bladder and make dysuria worse. In men, alcohol may irritate the prostate. Medicines  Take over-the-counter and prescription medicines only as told by your health care provider.  If you were prescribed an antibiotic medicine, take it as told by your health care provider. Do not stop taking the antibiotic even if you start to feel better. Contact a health care provider if:  You have a fever.  You develop pain in your back or sides.  You have nausea or vomiting.  You have blood in your urine.  You are not urinating as often as you usually do. Get help right away if:  Your pain is severe and not relieved with medicines.  You cannot eat or drink without vomiting.  You are confused.  You have a rapid heartbeat while at rest.  You have shaking or chills.  You feel extremely weak. Summary  Dysuria is pain or discomfort while urinating. Many different conditions can lead to dysuria.  If you have dysuria, you may have to urinate frequently or have the sudden feeling that you have to urinate (urgency).  Watch your condition for any changes. Keep all follow-up visits as told by your health care provider.  Make sure that you urinate often and drink enough fluid to keep your urine pale yellow. This information is not intended to replace advice given to you by your health care provider. Make sure you discuss any questions  you have with your health care provider. Document Released: 06/22/2004 Document Revised: 07/11/2017 Document Reviewed: 07/11/2017 Elsevier Interactive Patient Education  2019 [place]. Cephalexin tablets or capsules What is this medicine? CEPHALEXIN (sef a LEX in) is a cephalosporin antibiotic. It is used to treat certain kinds of bacterial infections It will not work for colds, flu, or other viral infections. This medicine may be used for other purposes; ask your health care provider  or pharmacist if you have questions. COMMON BRAND NAME(S): Biocef, Daxbia, Keflex, Keftab What should I tell my health care provider before I take this medicine? They need to know if you have any of these conditions: -kidney disease -stomach or intestine problems, especially colitis -an unusual or allergic reaction to cephalexin, other cephalosporins, penicillins, other antibiotics, medicines, foods, dyes or preservatives -pregnant or trying to get pregnant -breast-feeding How should I use this medicine? Take this medicine by mouth with a full glass of water. Follow the directions on the prescription label. This medicine can be taken with or without food. Take your medicine at regular intervals. Do not take your medicine more often than directed. Take all of your medicine as directed even if you think you are better. Do not skip doses or stop your medicine early. Talk to your pediatrician regarding the use of this medicine in children. While this drug may be prescribed for selected conditions, precautions do apply. Overdosage: If you think you have taken too much of this medicine contact a poison control center or emergency room at once. NOTE: This medicine is only for you. Do not share this medicine with others. What if I miss a dose? If you miss a dose, take it as soon as you can. If it is almost time for your next dose, take only that dose. Do not take double or extra doses. There should be at least 4 to 6 hours between doses. What may interact with this medicine? -probenecid -some other antibiotics This list may not describe all possible interactions. Give your health care provider a list of all the medicines, herbs, non-prescription drugs, or dietary supplements you use. Also tell them if you smoke, drink alcohol, or use illegal drugs. Some items may interact with your medicine. What should I watch for while using this medicine? Tell your doctor or health care professional if your symptoms  do not begin to improve in a few days. Do not treat diarrhea with over the counter products. Contact your doctor if you have diarrhea that lasts more than 2 days or if it is severe and watery. If you have diabetes, you may get a false-positive result for sugar in your urine. Check with your doctor or health care professional. What side effects may I notice from receiving this medicine? Side effects that you should report to your doctor or health care professional as soon as possible: -allergic reactions like skin rash, itching or hives, swelling of the face, lips, or tongue -breathing problems -pain or trouble passing urine -redness, blistering, peeling or loosening of the skin, including inside the mouth -severe or watery diarrhea -unusually weak or tired -yellowing of the eyes, skin Side effects that usually do not require medical attention (report to your doctor or health care professional if they continue or are bothersome): -gas or heartburn -genital or anal irritation -headache -joint or muscle pain -nausea, vomiting This list may not describe all possible side effects. Call your doctor for medical advice about side effects. You may report side effects to FDA at 1-800-FDA-1088.  Where should I keep my medicine? Keep out of the reach of children. Store at room temperature between 59 and 86 degrees F (15 and 30 degrees C). Throw away any unused medicine after the expiration date. NOTE: This sheet is a summary. It may not cover all possible information. If you have questions about this medicine, talk to your doctor, pharmacist, or health care provider.  2019 Elsevier/Gold Standard (2007-12-29 17:09:13)

## 2018-11-15 LAB — URINE CULTURE

## 2019-02-25 ENCOUNTER — Telehealth: Payer: Self-pay | Admitting: Certified Nurse Midwife

## 2019-02-25 NOTE — Telephone Encounter (Signed)
Spoke with patient.  She has been experiencing burning with urination, urgency and voiding small amounts x1 week.  Patient has been taking Keflex when symptoms began with no relief.  No new sexual partner, patient is not concerned about a STD.  Prefers not to come in due to 49 dollar co-pay and wondering if we should switch antibiotic since Keflex is not working.  Please advise.

## 2019-02-25 NOTE — Telephone Encounter (Signed)
Patient called requesting a refill on keflex. Thanks

## 2019-02-26 ENCOUNTER — Other Ambulatory Visit: Payer: BC Managed Care – PPO

## 2019-02-26 ENCOUNTER — Other Ambulatory Visit: Payer: Self-pay

## 2019-02-26 ENCOUNTER — Telehealth: Payer: Self-pay

## 2019-02-26 DIAGNOSIS — N390 Urinary tract infection, site not specified: Secondary | ICD-10-CM

## 2019-02-26 DIAGNOSIS — R300 Dysuria: Secondary | ICD-10-CM

## 2019-02-26 NOTE — Telephone Encounter (Signed)
Spoke with patient, aware of plan.  Scheduled lab visit today at 2 pm.  Patient verbalized understanding.

## 2019-02-26 NOTE — Telephone Encounter (Signed)
Coronavirus (COVID-19) Are you at risk?  Are you at risk for the Coronavirus (COVID-19)?  To be considered HIGH RISK for Coronavirus (COVID-19), you have to meet the following criteria:  . Traveled to [place], [place], [place], [place] or [place]; or in the [place] to [place], [place], [place], or [place]; and have fever, cough, and shortness of breath within the last 2 weeks of travel OR . Been in close contact with a person diagnosed with COVID-19 within the last 2 weeks and have fever, cough, and shortness of breath . IF YOU DO NOT MEET THESE CRITERIA, YOU ARE CONSIDERED LOW RISK FOR COVID-19.  What to do if you are HIGH RISK for COVID-19?  . If you are having a medical emergency, call 911. . Seek medical care right away. Before you go to a doctor's office, urgent care or emergency department, call ahead and tell them about your recent travel, contact with someone diagnosed with COVID-19, and your symptoms. You should receive instructions from your physician's office regarding next steps of care.  . When you arrive at healthcare provider, tell the healthcare staff immediately you have returned from visiting [place], [place], [place], [place] or [place]; or traveled in the [place] to [place], [place], [place], or [place]; in the last two weeks or you have been in close contact with a person diagnosed with COVID-19 in the last 2 weeks.   . Tell the health care staff about your symptoms: fever, cough and shortness of breath. . After you have been seen by a medical provider, you will be either: o Tested for (COVID-19) and discharged home on quarantine except to seek medical care if symptoms worsen, and asked to  - Stay home and avoid contact with others until you get your results (4-5 days)  - Avoid travel on public transportation if possible (such as bus, train, or airplane) or o Sent to the Emergency Department by EMS for evaluation, COVID-19 testing, and possible  admission depending on your condition and test results.  What to do if you are LOW RISK for COVID-19?  Reduce your risk of any infection by using the same precautions used for avoiding the common cold or flu:  . Wash your hands often with soap and warm water for at least 20 seconds.  If soap and water are not readily available, use an alcohol-based hand sanitizer with at least 60% alcohol.  . If coughing or sneezing, cover your mouth and nose by coughing or sneezing into the elbow areas of your shirt or coat, into a tissue or into your sleeve (not your hands). . Avoid shaking hands with others and consider head nods or verbal greetings only. . Avoid touching your eyes, nose, or mouth with unwashed hands.  . Avoid close contact with people who are sick. . Avoid places or events with large numbers of people in one location, like concerts or sporting events. . Carefully consider travel plans you have or are making. . If you are planning any travel outside or inside the [place], visit the CDC's Travelers' Health webpage for the latest health notices. . If you have some symptoms but not all symptoms, continue to monitor at home and seek medical attention if your symptoms worsen. . If you are having a medical emergency, call 911.   ADDITIONAL HEALTHCARE OPTIONS FOR PATIENTS  [facility] / e-Visit: [email or web address]         [facility]: [phone]  [facility]: [phone]                   [facility]: [phone]   Pre-screen negative, DM.   

## 2019-02-26 NOTE — Telephone Encounter (Signed)
Advise patient may come in to leave a urine sample for culture (not a visit) and will prescribe different antibiotic pending results. Thanks, JML

## 2019-03-02 LAB — URINE CULTURE

## 2019-03-03 ENCOUNTER — Other Ambulatory Visit: Payer: Self-pay | Admitting: Certified Nurse Midwife

## 2019-03-03 ENCOUNTER — Telehealth: Payer: Self-pay | Admitting: Certified Nurse Midwife

## 2019-03-03 MED ORDER — CIPROFLOXACIN HCL 500 MG PO TABS: 500.0000 mg | ORAL_TABLET | Freq: Two times a day (BID) | ORAL | 0 refills | Status: DC

## 2019-03-03 NOTE — Progress Notes (Signed)
Rx: Cipro, see orders. Multiple organism UTI treatment.    Diona Fanti, CNM [facility], [facility] 03/03/19 10:06 AM

## 2019-03-03 NOTE — Telephone Encounter (Signed)
The patient called and stated that she needs to speak with a nurse in regards to her needing to ask a few concerning medication questions in regards to her UTI. Please advise.

## 2019-03-06 ENCOUNTER — Telehealth: Payer: Self-pay

## 2019-03-06 NOTE — Telephone Encounter (Signed)
Taking care of by [facility]

## 2019-03-06 NOTE — Telephone Encounter (Signed)
Pt states she is not getting any better on the cipro.   Sx- urgency/frequncy. No fever.   Pls advise.

## 2019-03-06 NOTE — Telephone Encounter (Signed)
Returned patients call. Detailed message was left on voicemail- reassured her she was on the correct antibiotic. Reminded her she only started taking it on 03/03/19. Advised her she could take Azo or uribel for her symptoms. Encouraged her to increase fluid intake and to empty her bladder frequently. Also to give the antibiotics time to work.

## 2019-05-25 ENCOUNTER — Other Ambulatory Visit: Payer: Self-pay

## 2019-05-25 DIAGNOSIS — Z20822 Contact with and (suspected) exposure to covid-19: Secondary | ICD-10-CM

## 2019-05-26 LAB — NOVEL CORONAVIRUS, NAA: SARS-CoV-2, NAA: NOT DETECTED

## 2019-06-04 ENCOUNTER — Other Ambulatory Visit: Payer: Self-pay

## 2019-06-04 DIAGNOSIS — Z20822 Contact with and (suspected) exposure to covid-19: Secondary | ICD-10-CM

## 2019-06-05 LAB — NOVEL CORONAVIRUS, NAA: SARS-CoV-2, NAA: NOT DETECTED

## 2019-07-24 ENCOUNTER — Other Ambulatory Visit: Payer: Self-pay

## 2019-07-24 DIAGNOSIS — Z20822 Contact with and (suspected) exposure to covid-19: Secondary | ICD-10-CM

## 2019-07-26 LAB — NOVEL CORONAVIRUS, NAA: SARS-CoV-2, NAA: NOT DETECTED

## 2019-08-03 ENCOUNTER — Other Ambulatory Visit: Payer: Self-pay

## 2019-08-03 ENCOUNTER — Ambulatory Visit (INDEPENDENT_AMBULATORY_CARE_PROVIDER_SITE_OTHER): Payer: BC Managed Care – PPO | Admitting: Certified Nurse Midwife

## 2019-08-03 ENCOUNTER — Encounter: Payer: Self-pay | Admitting: Certified Nurse Midwife

## 2019-08-03 ENCOUNTER — Other Ambulatory Visit (HOSPITAL_COMMUNITY)
Admission: RE | Admit: 2019-08-03 | Discharge: 2019-08-03 | Disposition: A | Discharge status: Home / Self Care | Payer: BC Managed Care – PPO | Source: Ambulatory Visit | Attending: Certified Nurse Midwife | Admitting: Certified Nurse Midwife

## 2019-08-03 VITALS — BP 105/59 | HR 67 | Ht 67.0 in | Wt 139.6 lb

## 2019-08-03 DIAGNOSIS — Z13220 Encounter for screening for lipoid disorders: Secondary | ICD-10-CM | POA: Diagnosis not present

## 2019-08-03 DIAGNOSIS — L700 Acne vulgaris: Secondary | ICD-10-CM | POA: Insufficient documentation

## 2019-08-03 DIAGNOSIS — F329 Major depressive disorder, single episode, unspecified: Secondary | ICD-10-CM

## 2019-08-03 DIAGNOSIS — N926 Irregular menstruation, unspecified: Secondary | ICD-10-CM | POA: Insufficient documentation

## 2019-08-03 DIAGNOSIS — F32A Depression, unspecified: Secondary | ICD-10-CM

## 2019-08-03 DIAGNOSIS — N390 Urinary tract infection, site not specified: Secondary | ICD-10-CM

## 2019-08-03 DIAGNOSIS — Z124 Encounter for screening for malignant neoplasm of cervix: Secondary | ICD-10-CM | POA: Diagnosis present

## 2019-08-03 DIAGNOSIS — Z136 Encounter for screening for cardiovascular disorders: Secondary | ICD-10-CM

## 2019-08-03 DIAGNOSIS — Z01419 Encounter for gynecological examination (general) (routine) without abnormal findings: Secondary | ICD-10-CM | POA: Diagnosis not present

## 2019-08-03 MED ORDER — DOXYCYCLINE MONOHYDRATE 100 MG PO CAPS: 100.0000 mg | ORAL_CAPSULE | Freq: Every day | ORAL | 1 refills | Status: DC

## 2019-08-03 MED ORDER — CEPHALEXIN 250 MG PO CAPS: ORAL_CAPSULE | ORAL | 3 refills | Status: DC

## 2019-08-03 NOTE — Patient Instructions (Signed)
Preventive Care 21-29 Years Old, Female Preventive care refers to visits with your health care provider and lifestyle choices that can promote health and wellness. This includes:  A yearly physical exam. This may also be called an annual well check.  Regular dental visits and eye exams.  Immunizations.  Screening for certain conditions.  Healthy lifestyle choices, such as eating a healthy diet, getting regular exercise, not using drugs or products that contain nicotine and tobacco, and limiting alcohol use. What can I expect for my preventive care visit? Physical exam Your health care provider will check your:  Height and weight. This may be used to calculate body mass index (BMI), which tells if you are at a healthy weight.  Heart rate and blood pressure.  Skin for abnormal spots. Counseling Your health care provider may ask you questions about your:  Alcohol, tobacco, and drug use.  Emotional well-being.  Home and relationship well-being.  Sexual activity.  Eating habits.  Work and work environment.  Method of birth control.  Menstrual cycle.  Pregnancy history. What immunizations do I need?  Influenza (flu) vaccine  This is recommended every year. Tetanus, diphtheria, and pertussis (Tdap) vaccine  You may need a Td booster every 10 years. Varicella (chickenpox) vaccine  You may need this if you have not been vaccinated. Human papillomavirus (HPV) vaccine  If recommended by your health care provider, you may need three doses over 6 months. Measles, mumps, and rubella (MMR) vaccine  You may need at least one dose of MMR. You may also need a second dose. Meningococcal conjugate (MenACWY) vaccine  One dose is recommended if you are age 19-21 years and a first-year college student living in a residence hall, or if you have one of several medical conditions. You may also need additional booster doses. Pneumococcal conjugate (PCV13) vaccine  You may need  this if you have certain conditions and were not previously vaccinated. Pneumococcal polysaccharide (PPSV23) vaccine  You may need one or two doses if you smoke cigarettes or if you have certain conditions. Hepatitis A vaccine  You may need this if you have certain conditions or if you travel or work in places where you may be exposed to hepatitis A. Hepatitis B vaccine  You may need this if you have certain conditions or if you travel or work in places where you may be exposed to hepatitis B. Haemophilus influenzae type b (Hib) vaccine  You may need this if you have certain conditions. You may receive vaccines as individual doses or as more than one vaccine together in one shot (combination vaccines). Talk with your health care provider about the risks and benefits of combination vaccines. What tests do I need?  Blood tests  Lipid and cholesterol levels. These may be checked every 5 years starting at age 20.  Hepatitis C test.  Hepatitis B test. Screening  Diabetes screening. This is done by checking your blood sugar (glucose) after you have not eaten for a while (fasting).  Sexually transmitted disease (STD) testing.  BRCA-related cancer screening. This may be done if you have a family history of breast, ovarian, tubal, or peritoneal cancers.  Pelvic exam and Pap test. This may be done every 3 years starting at age 21. Starting at age 30, this may be done every 5 years if you have a Pap test in combination with an HPV test. Talk with your health care provider about your test results, treatment options, and if necessary, the need for more tests.   Follow these instructions at home: Eating and drinking   Eat a diet that includes fresh fruits and vegetables, whole grains, lean protein, and low-fat dairy.  Take vitamin and mineral supplements as recommended by your health care provider.  Do not drink alcohol if: ? Your health care provider tells you not to drink. ? You are  pregnant, may be pregnant, or are planning to become pregnant.  If you drink alcohol: ? Limit how much you have to 0-1 drink a day. ? Be aware of how much alcohol is in your drink. In the [place] one drink equals one 12 oz bottle of beer (355 mL), one 5 oz glass of wine (148 mL), or one 1 oz glass of hard liquor (44 mL). Lifestyle  Take daily care of your teeth and gums.  Stay active. Exercise for at least 30 minutes on 5 or more days each week.  Do not use any products that contain nicotine or tobacco, such as cigarettes, e-cigarettes, and chewing tobacco. If you need help quitting, ask your health care provider.  If you are sexually active, practice safe sex. Use a condom or other form of birth control (contraception) in order to prevent pregnancy and STIs (sexually transmitted infections). If you plan to become pregnant, see your health care provider for a preconception visit. What's next?  Visit your health care provider once a year for a well check visit.  Ask your health care provider how often you should have your eyes and teeth checked.  Stay up to date on all vaccines. This information is not intended to replace advice given to you by your health care provider. Make sure you discuss any questions you have with your health care provider. Document Released: 11/20/2001 Document Revised: 06/05/2018 Document Reviewed: 06/05/2018 Elsevier Patient Education  2020 [employer]  

## 2019-08-03 NOTE — Progress Notes (Signed)
GYNECOLOGY ANNUAL PREVENTATIVE CARE ENCOUNTER NOTE  History:     Susan Cooper is a 28 y.o. G0P0000 female here for a routine annual gynecologic exam.  Current complaints: is stressed from possible separation from her partner. Her cycles are becoming irregular and would like testing.  Denies abnormal vaginal bleeding, discharge, pelvic pain, problems with intercourse or other gynecologic concerns.      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 Contraception: none, stopped  Last Pap: few yrs ago Results were: Normal per pt.  Last mammogram: n/a    Obstetric History OB History  Gravida Para Term Preterm AB Living  0 0 0 0 0 0  SAB TAB Ectopic Multiple Live Births  0 0 0 0 0    Past Medical History:  Diagnosis Date  . Chronic kidney disease   . Kidney stone   . Urinary tract infection     No past surgical history on file.  Current Outpatient Medications on File Prior to Visit  Medication Sig Dispense Refill  . ibuprofen (ADVIL,MOTRIN) 200 MG tablet Take by mouth.    . cephALEXin (KEFLEX) 500 MG capsule Take 1 capsule (500 mg total) by mouth 4 (four) times daily. (Patient not taking: Reported on 08/03/2019) 28 capsule 0  . ciprofloxacin (CIPRO) 500 MG tablet Take 1 tablet (500 mg total) by mouth 2 (two) times daily. (Patient not taking: Reported on 08/03/2019) 14 tablet 0  . phenazopyridine (PYRIDIUM) 200 MG tablet Take 1 tablet (200 mg total) by mouth 3 (three) times daily as needed for pain (urethral spasm). (Patient not taking: Reported on 08/03/2019) 10 tablet 1   No current facility-administered medications on file prior to visit.     No Known Allergies  Social History:  reports that she has never smoked. She has never used smokeless tobacco. She reports current alcohol use. She reports that she does not use drugs.  Family History  Problem Relation Age of  Onset  . Hematuria Paternal Grandmother   . Kidney failure Paternal Grandmother   . Kidney disease Paternal Grandfather   . Thyroid disease Mother   . Thyroid disease Maternal Grandmother   . Melanoma Paternal Aunt   . Kidney cancer Neg Hx   . Prostate cancer Neg Hx   . Breast cancer Neg Hx   . Ovarian cancer Neg Hx   . Colon cancer Neg Hx     The following portions of the patient's history were reviewed and updated as appropriate: allergies, current medications, past family history, past medical history, past social history, past surgical history and problem list.  Review of Systems Pertinent items noted in HPI and remainder of comprehensive ROS otherwise negative.  Exercises daily, denies smoking, vaping , and drug use. Occasional alcohol.  Physical Exam:  BP (!) 105/59   Pulse 67   Ht 5\' 7"  (1.702 m)   Wt 139 lb 9 oz (63.3 kg)   LMP 07/17/2019 (Approximate)   BMI 21.86 kg/m  CONSTITUTIONAL: Well-developed, well-nourished female in no acute distress.  HENT:  Normocephalic, atraumatic, External right and left ear normal. Oropharynx is clear and moist EYES: Conjunctivae and EOM are normal. Pupils are equal, round, and reactive to light. No scleral icterus.  NECK: Normal range of motion, supple, no masses.  Normal thyroid.  SKIN: Skin is warm and dry. No rash noted. Not diaphoretic. No erythema. No pallor. MUSCULOSKELETAL: Normal range of motion. No  tenderness.  No cyanosis, clubbing, or edema.  2+ distal pulses. NEUROLOGIC: Alert and oriented to person, place, and time. Normal reflexes, muscle tone coordination. No cranial nerve deficit noted. PSYCHIATRIC: Normal mood and affect. Normal behavior. Normal judgment and thought content. CARDIOVASCULAR: Normal heart rate noted, regular rhythm RESPIRATORY: Clear to auscultation bilaterally. Effort and breath sounds normal, no problems with respiration noted. BREASTS: Symmetric in size. No masses, skin changes, nipple drainage, or  lymphadenopathy. ABDOMEN: Soft, normal bowel sounds, no distention noted.  No tenderness, rebound or guarding.  PELVIC: Normal appearing external genitalia; normal appearing vaginal mucosa and cervix.  No abnormal discharge noted.  Pap smear obtained. Contact bleeding present. Normal uterine size, no other palpable masses, no uterine or adnexal tenderness.   Assessment and Plan:   Annual Well Women Gyn Exam Will follow up results of pap smear and manage accordingly. Mammogram not indicated Labs: TSH, FSH/LH, prolactin, estrogen, testosterone, Lipid profile Orders: doxycycline-acne, refill on keflex (prevent UTI) Routine preventative health maintenance measures emphasized. Referral urology and counseling  Please refer to After Visit Summary for other counseling recommendations.   Philip Aspen, CNM

## 2019-08-04 LAB — LIPID PANEL
Chol/HDL Ratio: 2.3 ratio (ref 0.0–4.4)
Cholesterol, Total: 154 mg/dL (ref 100–199)
HDL: 67 mg/dL (ref >39)
LDL Chol Calc (NIH): 72 mg/dL (ref 0–99)
Triglycerides: 77 mg/dL (ref 0–149)
VLDL Cholesterol Cal: 15 mg/dL (ref 5–40)

## 2019-08-04 LAB — PROLACTIN: Prolactin: 17.1 ng/mL (ref 4.8–23.3)

## 2019-08-04 LAB — FSH/LH
FSH: 3.6 m[IU]/mL
LH: 15.7 m[IU]/mL

## 2019-08-04 LAB — ESTRADIOL: Estradiol: 163 pg/mL

## 2019-08-04 LAB — THYROID PANEL WITH TSH
Free Thyroxine Index: 1.9 (ref 1.2–4.9)
T3 Uptake Ratio: 32 % (ref 24–39)
T4, Total: 5.8 ug/dL (ref 4.5–12.0)
TSH: 1.25 u[IU]/mL (ref 0.450–4.500)

## 2019-08-04 LAB — TESTOSTERONE: Testosterone: 18 ng/dL (ref 8–48)

## 2019-08-05 LAB — CYTOLOGY - PAP: Diagnosis: NEGATIVE

## 2019-08-12 ENCOUNTER — Other Ambulatory Visit: Payer: Self-pay | Admitting: *Deleted

## 2019-08-12 DIAGNOSIS — Z20822 Contact with and (suspected) exposure to covid-19: Secondary | ICD-10-CM

## 2019-08-13 LAB — NOVEL CORONAVIRUS, NAA: SARS-CoV-2, NAA: NOT DETECTED

## 2019-08-14 ENCOUNTER — Telehealth: Payer: Self-pay | Admitting: General Practice

## 2019-08-14 NOTE — Telephone Encounter (Signed)
Negative COVID results given. Patient results "NOT Detected." Caller expressed understanding. ° °

## 2019-09-09 ENCOUNTER — Other Ambulatory Visit: Payer: Self-pay

## 2019-09-09 DIAGNOSIS — Z20822 Contact with and (suspected) exposure to covid-19: Secondary | ICD-10-CM

## 2019-09-11 LAB — NOVEL CORONAVIRUS, NAA: SARS-CoV-2, NAA: NOT DETECTED

## 2019-09-14 ENCOUNTER — Telehealth: Payer: Self-pay

## 2019-09-14 NOTE — Telephone Encounter (Signed)
We referred the patient to a urologist. Patient appt is tomo, 09/15/19. Office needs visit notes, asap.

## 2019-11-29 ENCOUNTER — Other Ambulatory Visit: Payer: Self-pay | Admitting: Certified Nurse Midwife

## 2020-03-11 IMAGING — US US RENAL
1 series · 14 of 25 positions shown · non-contrast
Comparison: None.

CLINICAL DATA: History of nephrolithiasis

EXAM:
RENAL / URINARY TRACT ULTRASOUND COMPLETE

[Series 1: us renal · 0.23mm/px · 14 of 43 slices shown]
[im 1/43]
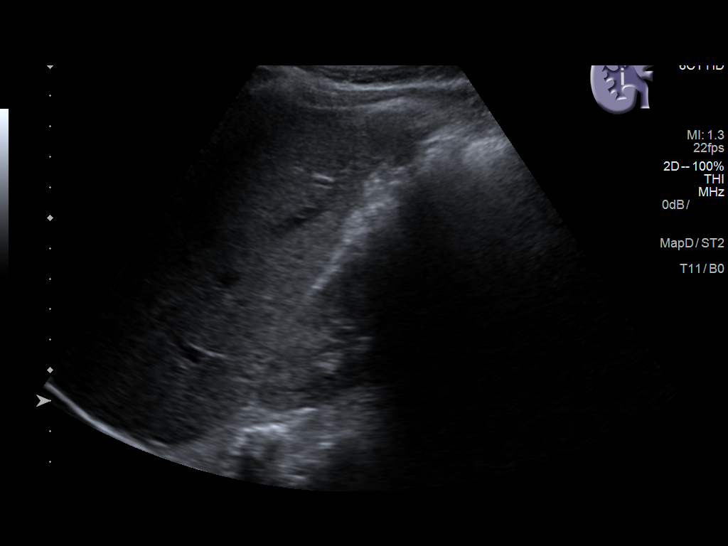
[im 4/43]
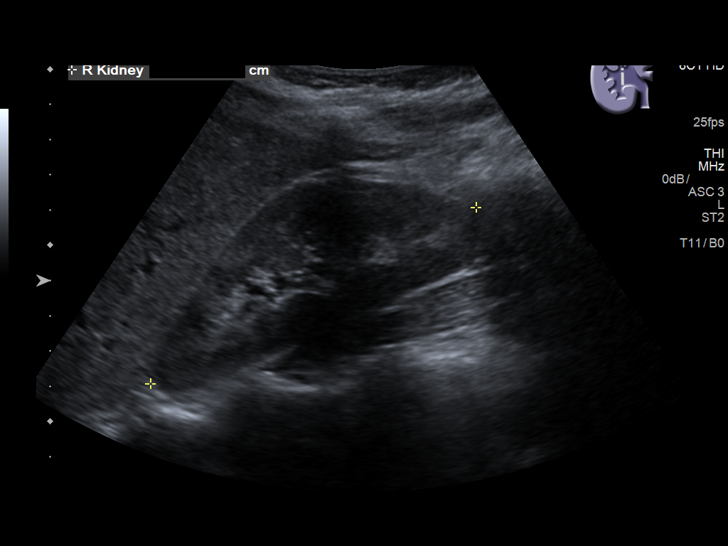
[im 8/43]
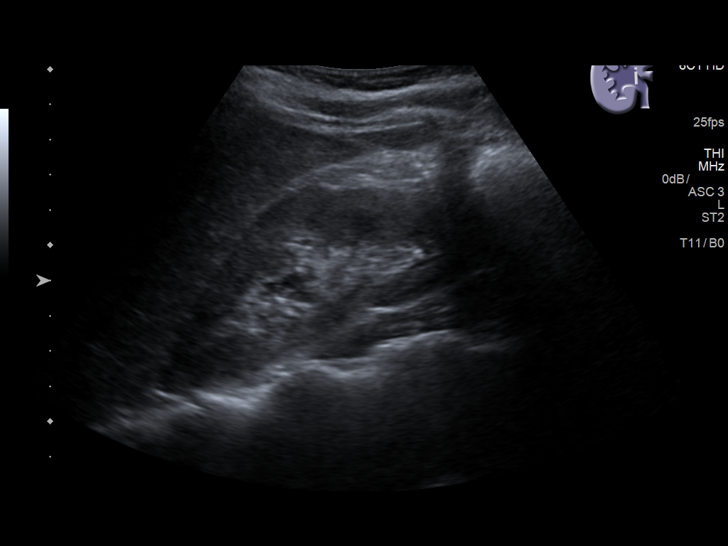
[im 11/43]
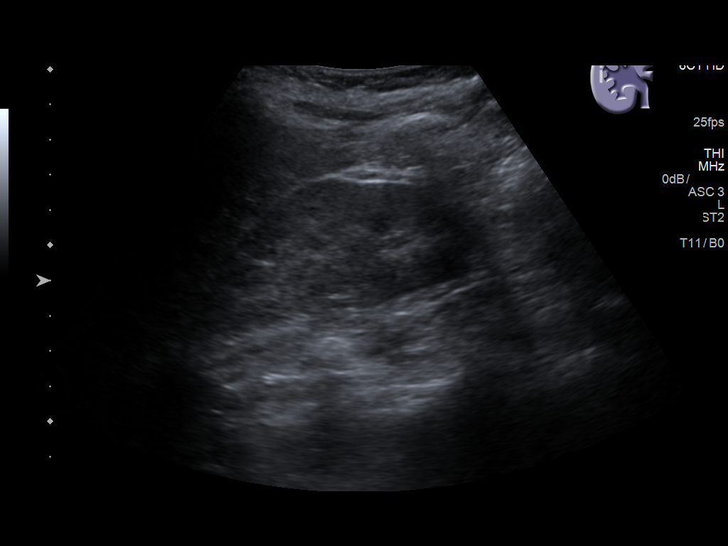
[im 15/43]
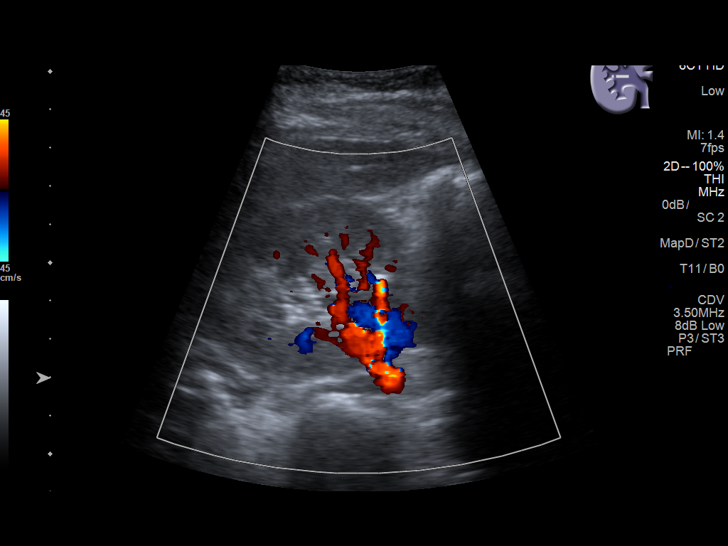
[im 16/43]
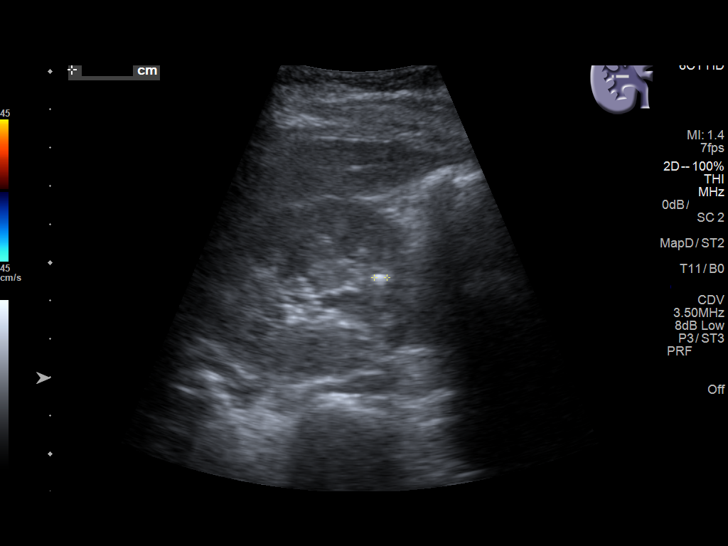
[im 20/43]
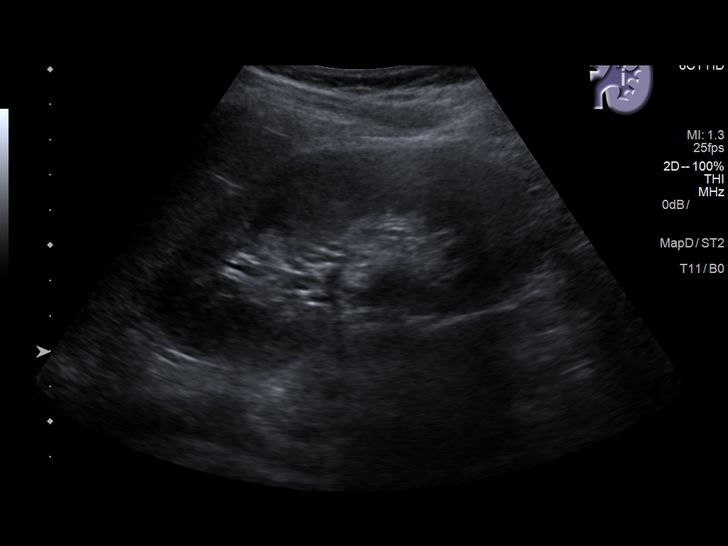
[im 23/43]
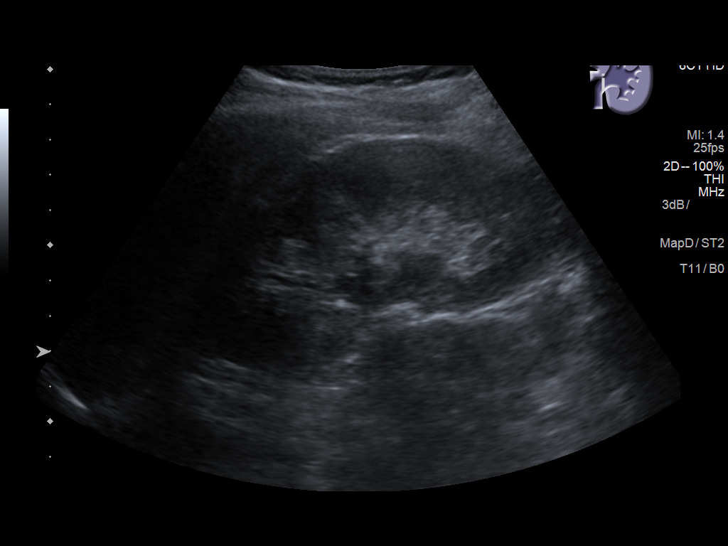
[im 27/43]
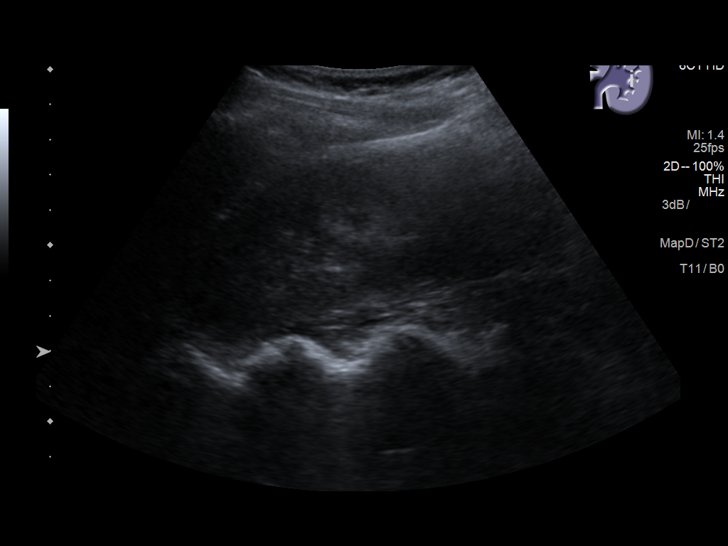
[im 29/43]
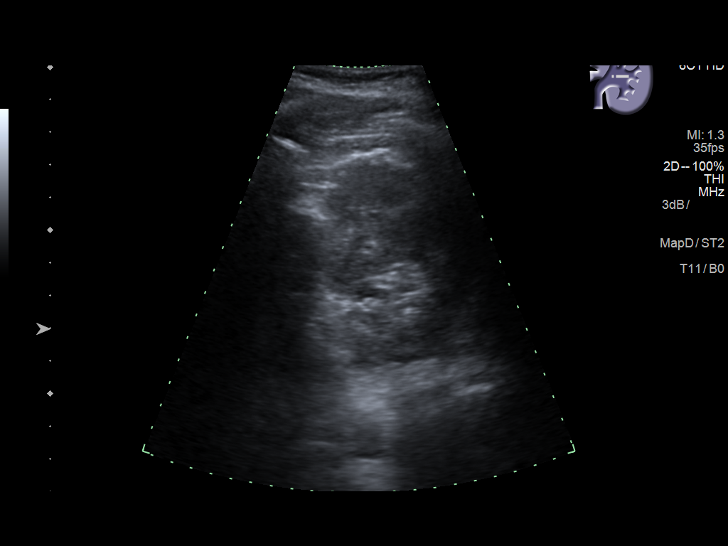
[im 32/43]
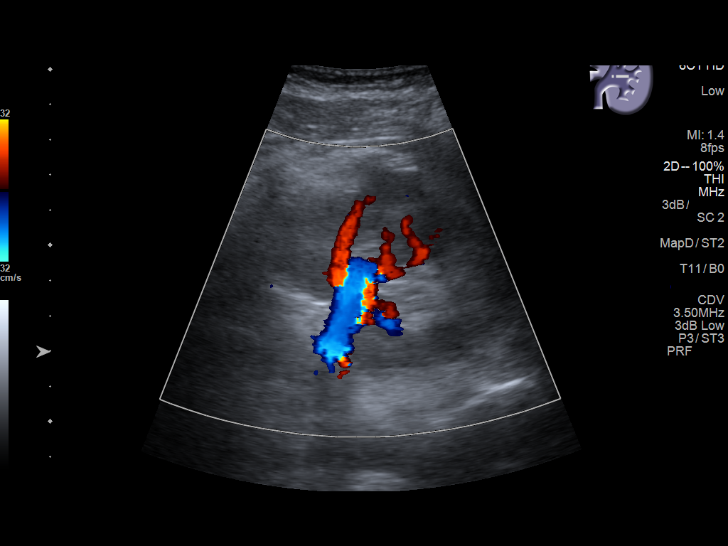
[im 36/43]
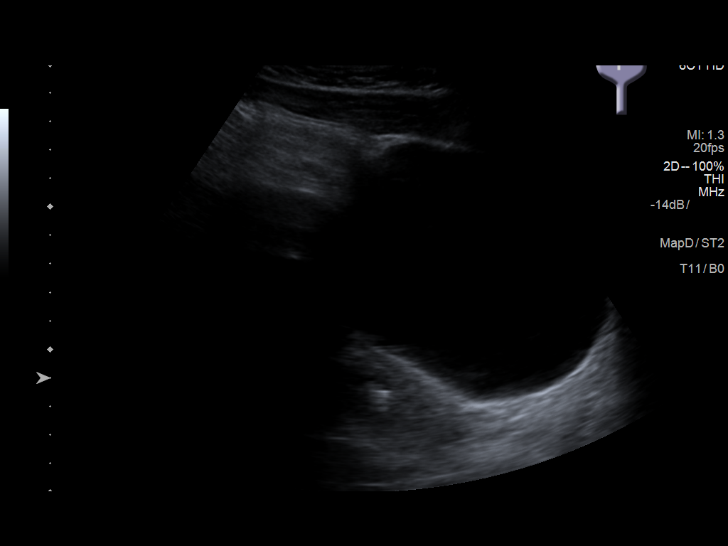
[im 39/43]
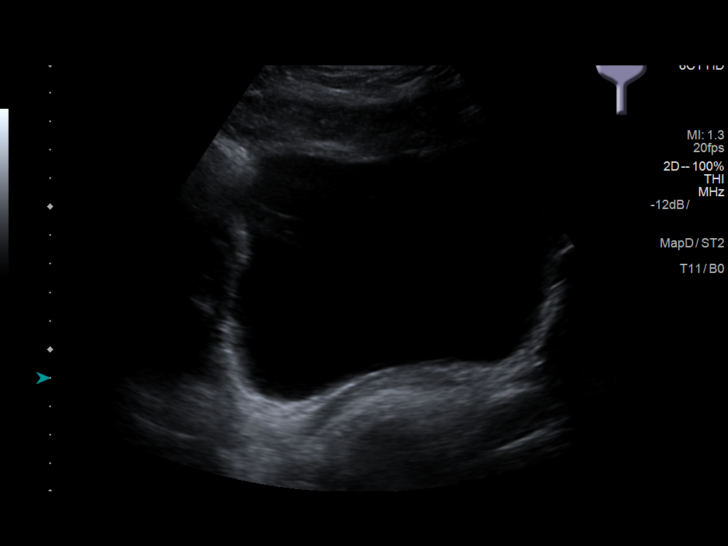
[im 43/43]
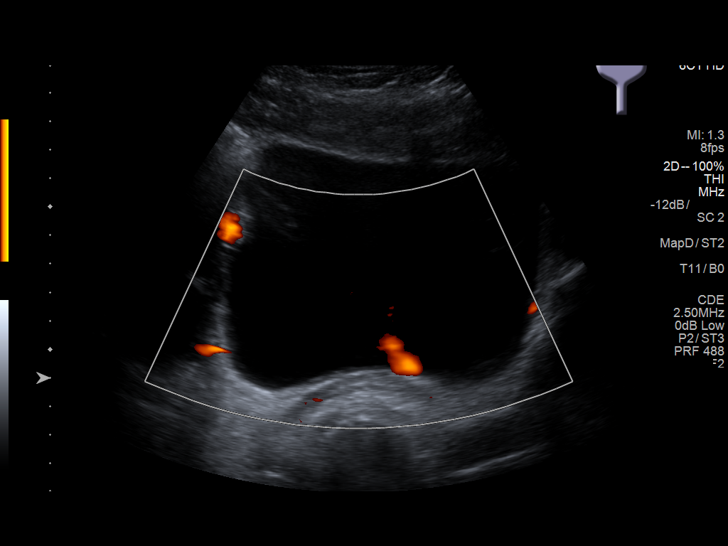

[14 of 25 positions shown; findings below may reference images not displayed]

FINDINGS: Right Kidney:

Length: 10.5 cm. Echogenicity within normal limits. No
hydronephrosis. Possible 3 mm stone at the midpole

Left Kidney:

Length: 11.8 cm. Echogenicity within normal limits. No mass or
hydronephrosis visualized.

Bladder:

Appears normal for degree of bladder distention.
IMPRESSION: 1. Negative for hydronephrosis
2. Possible 3 mm stone mid pole right kidney

## 2020-08-03 ENCOUNTER — Other Ambulatory Visit: Payer: Self-pay

## 2020-08-03 ENCOUNTER — Encounter: Payer: Self-pay | Admitting: Certified Nurse Midwife

## 2020-08-03 ENCOUNTER — Other Ambulatory Visit (HOSPITAL_COMMUNITY)
Admission: RE | Admit: 2020-08-03 | Discharge: 2020-08-03 | Disposition: A | Discharge status: Home / Self Care | Source: Ambulatory Visit | Attending: Certified Nurse Midwife | Admitting: Certified Nurse Midwife

## 2020-08-03 ENCOUNTER — Ambulatory Visit (INDEPENDENT_AMBULATORY_CARE_PROVIDER_SITE_OTHER): Admitting: Certified Nurse Midwife

## 2020-08-03 ENCOUNTER — Telehealth: Payer: Self-pay

## 2020-08-03 VITALS — BP 110/75 | HR 65 | Ht 67.0 in | Wt 143.2 lb

## 2020-08-03 DIAGNOSIS — Z114 Encounter for screening for human immunodeficiency virus [HIV]: Secondary | ICD-10-CM

## 2020-08-03 DIAGNOSIS — Z124 Encounter for screening for malignant neoplasm of cervix: Secondary | ICD-10-CM

## 2020-08-03 DIAGNOSIS — Z1159 Encounter for screening for other viral diseases: Secondary | ICD-10-CM

## 2020-08-03 DIAGNOSIS — Z01419 Encounter for gynecological examination (general) (routine) without abnormal findings: Secondary | ICD-10-CM

## 2020-08-03 DIAGNOSIS — R300 Dysuria: Secondary | ICD-10-CM | POA: Diagnosis not present

## 2020-08-03 LAB — POCT URINALYSIS DIPSTICK
Bilirubin, UA: NEGATIVE
Glucose, UA: NEGATIVE
Ketones, UA: NEGATIVE
Leukocytes, UA: NEGATIVE
Nitrite, UA: NEGATIVE
Protein, UA: NEGATIVE
Spec Grav, UA: 1.02 (ref 1.010–1.025)
Urobilinogen, UA: 0.2 E.U./dL
pH, UA: 6 (ref 5.0–8.0)

## 2020-08-03 NOTE — Progress Notes (Signed)
GYNECOLOGY ANNUAL PREVENTATIVE CARE ENCOUNTER NOTE  History:     Susan Cooper is a 29 y.o. G0P0000 female here for a routine annual gynecologic exam.  Current complaints: none.   Denies abnormal vaginal bleeding, discharge, pelvic pain, problems with intercourse or other gynecologic concerns.     Social Relationship:getting divorced finalize this yr. [place] relationship female pratner considering getting pregnant Living:alone Work:[employer] Exercise: [employer] cardio and weights Smoke/Alcohol/drug use:  Gynecologic History Patient's last menstrual period was 07/24/2020 (exact date). Contraception: natural family planning Last Pap: 08/03/19 Results were: normal , repeated per pt request Last mammogram: n/a  Upstream - 08/03/20 1438      Pregnancy Intention Screening   Does the patient want to become pregnant in the next year? No    Does the patient's partner want to become pregnant in the next year? No    Would the patient like to discuss contraceptive options today? No      Contraception Wrap Up   Contraception Counseling Provided No          The pregnancy intention screening data noted above was reviewed. Potential methods of contraception were discussed. The patient elected to proceed with No Method - Other Reason.   Obstetric History OB History  Gravida Para Term Preterm AB Living  0 0 0 0 0 0  SAB TAB Ectopic Multiple Live Births  0 0 0 0 0    Past Medical History:  Diagnosis Date  . Chronic kidney disease   . Kidney stone   . Urinary tract infection     No past surgical history on file.  Current Outpatient Medications on File Prior to Visit  Medication Sig Dispense Refill  . cephALEXin (KEFLEX) 250 MG capsule Take one capsule after intercourse 90 capsule 3  . ibuprofen (ADVIL,MOTRIN) 200 MG tablet Take by mouth.     No current facility-administered medications on file prior to visit.    No Known Allergies  Social History:  reports that she has never  smoked. She has never used smokeless tobacco. She reports current alcohol use. She reports that she does not use drugs.  Family History  Problem Relation Age of Onset  . Hematuria Paternal Grandmother   . Kidney failure Paternal Grandmother   . Kidney disease Paternal Grandfather   . Thyroid disease Mother   . Hypertension Mother   . Thyroid disease Maternal Grandmother   . Melanoma Paternal Aunt   . Hypertension Father   . Kidney cancer Neg Hx   . Breast cancer Neg Hx   . Ovarian cancer Neg Hx   . Colon cancer Neg Hx     The following portions of the patient's history were reviewed and updated as appropriate: allergies, current medications, past family history, past medical history, past social history, past surgical history and problem list.  Review of Systems Pertinent items noted in HPI and remainder of comprehensive ROS otherwise negative.  Physical Exam:  BP 110/75   Pulse 65   Ht 5\' 7"  (1.702 m)   Wt 143 lb 3 oz (64.9 kg)   LMP 07/24/2020 (Exact Date)   BMI 22.43 kg/m  CONSTITUTIONAL: Well-developed, well-nourished female in no acute distress.  HENT:  Normocephalic, atraumatic, External right and left ear normal. Oropharynx is clear and moist EYES: Conjunctivae and EOM are normal. Pupils are equal, round, and reactive to light. No scleral icterus.  NECK: Normal range of motion, supple, no masses.  Normal thyroid.  SKIN: Skin is warm and  dry. No rash noted. Not diaphoretic. No erythema. No pallor. MUSCULOSKELETAL: Normal range of motion. No tenderness.  No cyanosis, clubbing, or edema.  2+ distal pulses. NEUROLOGIC: Alert and oriented to person, place, and time. Normal reflexes, muscle tone coordination.  PSYCHIATRIC: Normal mood and affect. Normal behavior. Normal judgment and thought content. CARDIOVASCULAR: Normal heart rate noted, regular rhythm RESPIRATORY: Clear to auscultation bilaterally. Effort and breath sounds normal, no problems with respiration  noted. BREASTS: Symmetric in size. No masses, tenderness, skin changes, nipple drainage, or lymphadenopathy bilaterally.  ABDOMEN: Soft, no distention noted.  No tenderness, rebound or guarding.  PELVIC: Normal appearing external genitalia and urethral meatus; normal appearing vaginal mucosa and cervix.  No abnormal discharge noted.  Pap smear obtained.contact bleeding present   Normal uterine size, no other palpable masses, no uterine or adnexal tenderness.  .   Assessment and Plan:    1. Dysuria - POCT urinalysis dipstick - Urine Culture   Pap:Will follow up results of pap smear and manage accordingly. Mammogram :n/a  Labs:Hep c, HIV Refills:none Referral:none Routine preventative health maintenance measures emphasized. Please refer to After Visit Summary for other counseling recommendations.      Philip Aspen, CNM [facility]

## 2020-08-03 NOTE — Patient Instructions (Signed)
Preventive Care 21-30 Years Old, Female Preventive care refers to visits with your health care provider and lifestyle choices that can promote health and wellness. This includes:  A yearly physical exam. This may also be called an annual well check.  Regular dental visits and eye exams.  Immunizations.  Screening for certain conditions.  Healthy lifestyle choices, such as eating a healthy diet, getting regular exercise, not using drugs or products that contain nicotine and tobacco, and limiting alcohol use. What can I expect for my preventive care visit? Physical exam Your health care provider will check your:  Height and weight. This may be used to calculate body mass index (BMI), which tells if you are at a healthy weight.  Heart rate and blood pressure.  Skin for abnormal spots. Counseling Your health care provider may ask you questions about your:  Alcohol, tobacco, and drug use.  Emotional well-being.  Home and relationship well-being.  Sexual activity.  Eating habits.  Work and work environment.  Method of birth control.  Menstrual cycle.  Pregnancy history. What immunizations do I need?  Influenza (flu) vaccine  This is recommended every year. Tetanus, diphtheria, and pertussis (Tdap) vaccine  You may need a Td booster every 10 years. Varicella (chickenpox) vaccine  You may need this if you have not been vaccinated. Human papillomavirus (HPV) vaccine  If recommended by your health care provider, you may need three doses over 6 months. Measles, mumps, and rubella (MMR) vaccine  You may need at least one dose of MMR. You may also need a second dose. Meningococcal conjugate (MenACWY) vaccine  One dose is recommended if you are age 19-21 years and a first-year college student living in a residence hall, or if you have one of several medical conditions. You may also need additional booster doses. Pneumococcal conjugate (PCV13) vaccine  You may need  this if you have certain conditions and were not previously vaccinated. Pneumococcal polysaccharide (PPSV23) vaccine  You may need one or two doses if you smoke cigarettes or if you have certain conditions. Hepatitis A vaccine  You may need this if you have certain conditions or if you travel or work in places where you may be exposed to hepatitis A. Hepatitis B vaccine  You may need this if you have certain conditions or if you travel or work in places where you may be exposed to hepatitis B. Haemophilus influenzae type b (Hib) vaccine  You may need this if you have certain conditions. You may receive vaccines as individual doses or as more than one vaccine together in one shot (combination vaccines). Talk with your health care provider about the risks and benefits of combination vaccines. What tests do I need?  Blood tests  Lipid and cholesterol levels. These may be checked every 5 years starting at age 20.  Hepatitis C test.  Hepatitis B test. Screening  Diabetes screening. This is done by checking your blood sugar (glucose) after you have not eaten for a while (fasting).  Sexually transmitted disease (STD) testing.  BRCA-related cancer screening. This may be done if you have a family history of breast, ovarian, tubal, or peritoneal cancers.  Pelvic exam and Pap test. This may be done every 3 years starting at age 21. Starting at age 30, this may be done every 5 years if you have a Pap test in combination with an HPV test. Talk with your health care provider about your test results, treatment options, and if necessary, the need for more tests.   Follow these instructions at home: Eating and drinking   Eat a diet that includes fresh fruits and vegetables, whole grains, lean protein, and low-fat dairy.  Take vitamin and mineral supplements as recommended by your health care provider.  Do not drink alcohol if: ? Your health care provider tells you not to drink. ? You are  pregnant, may be pregnant, or are planning to become pregnant.  If you drink alcohol: ? Limit how much you have to 0-1 drink a day. ? Be aware of how much alcohol is in your drink. In the [place] one drink equals one 12 oz bottle of beer (355 mL), one 5 oz glass of wine (148 mL), or one 1 oz glass of hard liquor (44 mL). Lifestyle  Take daily care of your teeth and gums.  Stay active. Exercise for at least 30 minutes on 5 or more days each week.  Do not use any products that contain nicotine or tobacco, such as cigarettes, e-cigarettes, and chewing tobacco. If you need help quitting, ask your health care provider.  If you are sexually active, practice safe sex. Use a condom or other form of birth control (contraception) in order to prevent pregnancy and STIs (sexually transmitted infections). If you plan to become pregnant, see your health care provider for a preconception visit. What's next?  Visit your health care provider once a year for a well check visit.  Ask your health care provider how often you should have your eyes and teeth checked.  Stay up to date on all vaccines. This information is not intended to replace advice given to you by your health care provider. Make sure you discuss any questions you have with your health care provider. Document Revised: 06/05/2018 Document Reviewed: 06/05/2018 Elsevier Patient Education  2020 [employer].

## 2020-08-03 NOTE — Telephone Encounter (Signed)
mychart message sent to patient

## 2020-08-04 LAB — HIV ANTIBODY (ROUTINE TESTING W REFLEX): HIV Screen 4th Generation wRfx: NONREACTIVE

## 2020-08-04 LAB — HEPATITIS C ANTIBODY: Hep C Virus Ab: <0.1 s/co ratio (ref 0.0–0.9)

## 2020-08-05 ENCOUNTER — Other Ambulatory Visit: Payer: Self-pay | Admitting: Surgical

## 2020-08-05 DIAGNOSIS — N390 Urinary tract infection, site not specified: Secondary | ICD-10-CM

## 2020-08-05 LAB — URINE CULTURE

## 2020-08-05 MED ORDER — CEPHALEXIN 250 MG PO CAPS: ORAL_CAPSULE | ORAL | 0 refills | Status: DC

## 2020-08-05 NOTE — Telephone Encounter (Signed)
Pt called in and stated that she needs a refill on her Kelex 200 milligram sent to the [facility] on [address]. Please advise

## 2020-08-11 LAB — CYTOLOGY - PAP
Comment: NEGATIVE
Comment: NEGATIVE
Diagnosis: NEGATIVE
HPV 16: NEGATIVE
HPV 18 / 45: NEGATIVE
High risk HPV: POSITIVE — AB

## 2020-08-15 NOTE — Telephone Encounter (Signed)
Called pt , discussed pap smear results. All questions answered.   Philip Aspen, CNM

## 2020-09-15 ENCOUNTER — Other Ambulatory Visit: Payer: Self-pay

## 2020-09-15 ENCOUNTER — Ambulatory Visit (INDEPENDENT_AMBULATORY_CARE_PROVIDER_SITE_OTHER): Admitting: Family Medicine

## 2020-09-15 ENCOUNTER — Encounter: Payer: Self-pay | Admitting: Family Medicine

## 2020-09-15 VITALS — BP 120/80 | HR 66 | Temp 98.0°F | Ht 67.0 in | Wt 142.0 lb

## 2020-09-15 DIAGNOSIS — Z7689 Persons encountering health services in other specified circumstances: Secondary | ICD-10-CM | POA: Diagnosis not present

## 2020-09-15 MED ORDER — HYDROXYZINE HCL 25 MG PO TABS: 25.0000 mg | ORAL_TABLET | Freq: Every evening | ORAL | 2 refills | Status: DC | PRN

## 2020-09-15 NOTE — Progress Notes (Signed)
Subjective:    Patient ID: Susan Cooper, female    DOB: April 27, 1991, 29 y.o.   MRN: [id number]  HPI  Patient is a very pleasant 29 year old Caucasian female who presents today to establish care.  Only concern is insomnia.  She is going through a divorce.  She states that she will feel tired and go to bed and then find herself lying in bed unable to sleep.  She has tried white noise.  She has tried a Engineer, civil (consulting).  She has tried the sound of ocean in the sound of rain.  Nothing thus far has helped.  She attributes some of this to stress.  She denies any depression or mania.  Otherwise she is doing well with no concerns.  She does not have any children.  She has no significant past medical history.  Aside from wisdom teeth surgery, she has no significant past surgical history. Past Medical History:  Diagnosis Date  . Chronic kidney disease   . Kidney stone   . Urinary tract infection    Past Surgical History:  Procedure Laterality Date  . WISDOM TOOTH EXTRACTION     Current Outpatient Medications on File Prior to Visit  Medication Sig Dispense Refill  . cephALEXin (KEFLEX) 250 MG capsule Take one capsule after intercourse 90 capsule 0  . ibuprofen (ADVIL,MOTRIN) 200 MG tablet Take by mouth.     No current facility-administered medications on file prior to visit.   No Known Allergies Social History   Socioeconomic History  . Marital status: Married    Spouse name: Not on file  . Number of children: Not on file  . Years of education: Not on file  . Highest education level: Not on file  Occupational History  . Not on file  Tobacco Use  . Smoking status: Never Smoker  . Smokeless tobacco: Never Used  Vaping Use  . Vaping Use: Never used  Substance and Sexual Activity  . Alcohol use: Yes    Comment: occasional   . Drug use: No  . Sexual activity: Yes    Birth control/protection: None  Other Topics Concern  . Not on file  Social History Narrative  . Not on file   Social  Determinants of Health   Financial Resource Strain: Not on file  Food Insecurity: Not on file  Transportation Needs: Not on file  Physical Activity: Not on file  Stress: Not on file  Social Connections: Not on file  Intimate Partner Violence: Not on file   Family History  Problem Relation Age of Onset  . Hematuria Paternal Grandmother   . Kidney failure Paternal Grandmother   . Kidney disease Paternal Grandfather   . Thyroid disease Mother   . Hypertension Mother   . Thyroid disease Maternal Grandmother   . Melanoma Paternal Aunt   . Hypertension Father   . Kidney cancer Neg Hx   . Breast cancer Neg Hx   . Ovarian cancer Neg Hx   . Colon cancer Neg Hx      Review of Systems  All other systems reviewed and are negative.      Objective:   Physical Exam Vitals reviewed.  Constitutional:      General: She is not in acute distress.    Appearance: Normal appearance. She is normal weight. She is not ill-appearing, toxic-appearing or diaphoretic.  HENT:     Right Ear: Tympanic membrane and ear canal normal.     Left Ear: Tympanic membrane and ear canal normal.  Nose: Nose normal. No congestion or rhinorrhea.     Mouth/Throat:     Mouth: Mucous membranes are moist.     Pharynx: Oropharynx is clear. No oropharyngeal exudate or posterior oropharyngeal erythema.  Eyes:     General: No scleral icterus.       Right eye: No discharge.        Left eye: No discharge.     Extraocular Movements: Extraocular movements intact.     Conjunctiva/sclera: Conjunctivae normal.     Pupils: Pupils are equal, round, and reactive to light.  Cardiovascular:     Rate and Rhythm: Normal rate and regular rhythm.     Heart sounds: Normal heart sounds. No murmur heard. No friction rub. No gallop.   Pulmonary:     Effort: Pulmonary effort is normal. No respiratory distress.     Breath sounds: Normal breath sounds. No stridor. No wheezing, rhonchi or rales.  Chest:     Chest wall: No  tenderness.  Abdominal:     General: Bowel sounds are normal. There is no distension.     Palpations: Abdomen is soft.     Tenderness: There is no abdominal tenderness. There is no guarding or rebound.     Hernia: No hernia is present.  Musculoskeletal:     Right lower leg: No edema.     Left lower leg: No edema.  Skin:    Coloration: Skin is not jaundiced or pale.     Findings: No bruising, erythema, lesion or rash.  Neurological:     General: No focal deficit present.     Mental Status: She is alert and oriented to person, place, and time. Mental status is at baseline.     Cranial Nerves: No cranial nerve deficit.     Sensory: No sensory deficit.     Motor: No weakness.     Coordination: Coordination normal.     Gait: Gait normal.     Deep Tendon Reflexes: Reflexes normal.  Psychiatric:        Mood and Affect: Mood normal.        Behavior: Behavior normal.        Thought Content: Thought content normal.        Judgment: Judgment normal.           Assessment & Plan:  Establishing care with new doctor, encounter for - Plan: CBC with Differential/Platelet, COMPLETE METABOLIC PANEL WITH GFR  Physical exam today is completely normal.  Patient had a pap smear within the last year that was normal.  She has no family history of breast cancer.  Therefore she does not require mammogram.  Her immunizations are up-to-date for the [employer].  She would like to try hydroxyzine 25 mg to 50 mg p.o. nightly as needed insomnia.  I will check a CBC and a CMP.  Lipid panel was checked last year and was normal.  Regular anticipatory guidance is provided.Marland Kitchen

## 2020-09-16 LAB — CBC WITH DIFFERENTIAL/PLATELET
Absolute Monocytes: 453 cells/uL (ref 200–950)
Basophils Absolute: 93 cells/uL (ref 0–200)
Basophils Relative: 0.9 %
Eosinophils Absolute: 82 cells/uL (ref 15–500)
Eosinophils Relative: 0.8 %
HCT: 37.9 % (ref 35.0–45.0)
Hemoglobin: 12.8 g/dL (ref 11.7–15.5)
Lymphs Abs: 2791 cells/uL (ref 850–3900)
MCH: 30.1 pg (ref 27.0–33.0)
MCHC: 33.8 g/dL (ref 32.0–36.0)
MCV: 89.2 fL (ref 80.0–100.0)
MPV: 11.8 fL (ref 7.5–12.5)
Monocytes Relative: 4.4 %
Neutro Abs: 6880 cells/uL (ref 1500–7800)
Neutrophils Relative %: 66.8 %
Platelets: 252 10*3/uL (ref 140–400)
RBC: 4.25 10*6/uL (ref 3.80–5.10)
RDW: 11.7 % (ref 11.0–15.0)
Total Lymphocyte: 27.1 %
WBC: 10.3 10*3/uL (ref 3.8–10.8)

## 2020-09-16 LAB — COMPLETE METABOLIC PANEL WITH GFR
AG Ratio: 1.9 (calc) (ref 1.0–2.5)
ALT: 12 U/L (ref 6–29)
AST: 17 U/L (ref 10–30)
Albumin: 4.8 g/dL (ref 3.6–5.1)
Alkaline phosphatase (APISO): 46 U/L (ref 31–125)
BUN: 16 mg/dL (ref 7–25)
CO2: 25 mmol/L (ref 20–32)
Calcium: 9.5 mg/dL (ref 8.6–10.2)
Chloride: 103 mmol/L (ref 98–110)
Creat: 0.74 mg/dL (ref 0.50–1.10)
GFR, Est African American: 126 mL/min/{1.73_m2} (ref >=60)
GFR, Est Non African American: 109 mL/min/{1.73_m2} (ref >=60)
Globulin: 2.5 g/dL (calc) (ref 1.9–3.7)
Glucose, Bld: 83 mg/dL (ref 65–99)
Potassium: 4 mmol/L (ref 3.5–5.3)
Sodium: 137 mmol/L (ref 135–146)
Total Bilirubin: 0.4 mg/dL (ref 0.2–1.2)
Total Protein: 7.3 g/dL (ref 6.1–8.1)

## 2021-01-13 ENCOUNTER — Ambulatory Visit (INDEPENDENT_AMBULATORY_CARE_PROVIDER_SITE_OTHER): Admitting: Family Medicine

## 2021-01-13 ENCOUNTER — Encounter: Payer: Self-pay | Admitting: Family Medicine

## 2021-01-13 ENCOUNTER — Other Ambulatory Visit: Payer: Self-pay

## 2021-01-13 VITALS — BP 112/62 | HR 88 | Temp 98.0°F | Resp 14 | Ht 67.0 in | Wt 148.0 lb

## 2021-01-13 DIAGNOSIS — F515 Nightmare disorder: Secondary | ICD-10-CM

## 2021-01-13 DIAGNOSIS — H9190 Unspecified hearing loss, unspecified ear: Secondary | ICD-10-CM

## 2021-01-13 MED ORDER — PRAZOSIN HCL 1 MG PO CAPS: 1.0000 mg | ORAL_CAPSULE | Freq: Every day | ORAL | 3 refills | Status: DC

## 2021-01-13 MED ORDER — CEPHALEXIN 250 MG PO CAPS: 250.0000 mg | ORAL_CAPSULE | Freq: Every day | ORAL | 1 refills | Status: DC

## 2021-01-13 NOTE — Progress Notes (Signed)
Subjective:    Patient ID: Susan Cooper, female    DOB: [birth year], 30 y.o.   MRN: [id number]  HPI  Patient is a very pleasant 30 year old Caucasian female here today requesting a referral for an audiologist.  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  On her most recent health assessment, she was found to have hearing loss.  She was recommended to see an audiologist.  We performed a hearing screen today.  The patient did extremely well at low frequencies hearing 500, 1000, and 2000 Hz down to 20 dB.  However she had noticeable high-frequency hearing loss unable to appreciate 4000 Hz in 1 year and only able to hear at 40 dB in the other ear.  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  However it does not cause her trouble in her day-to-day life.  She is also requesting a refill on Keflex.  She uses this prophylactically to prevent urinary tract infections.  She takes 1 tablet after intercourse or after menstrual cycles.  She would like a refill on this medication.  This was previously prescribed by her gynecologist however she misplaced the prescription.  Lastly she continues to have trouble sleeping.  The hydroxyzine that I gave her was ineffective.  She states that she is having very disturbing nightmares now.  She does not describe it as about life events.  It is not PTSD.  However they are strange nightmares that wake her up in a panic.  She is unable to get back to sleep.  They are occurring on a somewhat regular basis. Past Medical History:  Diagnosis Date  . Chronic kidney disease   . Kidney stone   . Urinary tract infection    Past Surgical History:  Procedure Laterality Date  . WISDOM TOOTH EXTRACTION     No current outpatient medications on file prior to visit.   No current facility-administered medications on file prior to visit.   No Known Allergies Social History   Socioeconomic History  . Marital status: Married     Spouse name: Not on file  . Number of children: Not on file  . Years of education: Not on file  . Highest education level: Not on file  Occupational History  . Not on file  Tobacco Use  . Smoking status: Never Smoker  . Smokeless tobacco: Never Used  Vaping Use  . Vaping Use: Never used  Substance and Sexual Activity  . Alcohol use: Yes    Comment: occasional   . Drug use: No  . Sexual activity: Yes    Birth control/protection: None  Other Topics Concern  . Not on file  Social History Narrative  . Not on file   Social Determinants of Health   Financial Resource Strain: Not on file  Food Insecurity: Not on file  Transportation Needs: Not on file  Physical Activity: Not on file  Stress: Not on file  Social Connections: Not on file  Intimate Partner Violence: Not on file   Family History  Problem Relation Age of Onset  . Hematuria Paternal Grandmother   . Kidney failure Paternal Grandmother   . Kidney disease Paternal Grandfather   . Thyroid disease Mother   . Hypertension Mother   . Thyroid disease Maternal Grandmother   . Melanoma Paternal Aunt   . Hypertension Father   . Kidney cancer Neg Hx   . Breast cancer Neg Hx   . Ovarian cancer  Neg Hx   . Colon cancer Neg Hx      Review of Systems  All other systems reviewed and are negative.      Objective:   Physical Exam Vitals reviewed.  Constitutional:      General: She is not in acute distress.    Appearance: Normal appearance. She is normal weight. She is not ill-appearing, toxic-appearing or diaphoretic.  HENT:     Right Ear: Tympanic membrane and ear canal normal.     Left Ear: Tympanic membrane and ear canal normal.     Nose: Nose normal. No congestion or rhinorrhea.     Mouth/Throat:     Mouth: Mucous membranes are moist.     Pharynx: Oropharynx is clear. No oropharyngeal exudate or posterior oropharyngeal erythema.  Eyes:     General: No scleral icterus.       Right eye: No discharge.         Left eye: No discharge.     Extraocular Movements: Extraocular movements intact.     Conjunctiva/sclera: Conjunctivae normal.     Pupils: Pupils are equal, round, and reactive to light.  Cardiovascular:     Rate and Rhythm: Normal rate and regular rhythm.     Heart sounds: Normal heart sounds. No murmur heard. No friction rub. No gallop.   Pulmonary:     Effort: Pulmonary effort is normal. No respiratory distress.     Breath sounds: Normal breath sounds. No stridor. No wheezing, rhonchi or rales.  Chest:     Chest wall: No tenderness.  Abdominal:     General: Bowel sounds are normal. There is no distension.     Palpations: Abdomen is soft.     Tenderness: There is no abdominal tenderness. There is no guarding or rebound.     Hernia: No hernia is present.  Musculoskeletal:     Right lower leg: No edema.     Left lower leg: No edema.  Skin:    Coloration: Skin is not jaundiced or pale.     Findings: No bruising, erythema, lesion or rash.  Neurological:     General: No focal deficit present.     Mental Status: She is alert and oriented to person, place, and time. Mental status is at baseline.     Cranial Nerves: No cranial nerve deficit.     Sensory: No sensory deficit.     Motor: No weakness.     Coordination: Coordination normal.     Gait: Gait normal.     Deep Tendon Reflexes: Reflexes normal.  Psychiatric:        Mood and Affect: Mood normal.        Behavior: Behavior normal.        Thought Content: Thought content normal.        Judgment: Judgment normal.           Assessment & Plan:  Hearing loss, unspecified hearing loss type, unspecified laterality  Nightmares  We discussed trying Xanax to help her sleep versus Ambien however her mother has a history of dependence on Xanax so she wants to avoid any medication that can be habit-forming.  Therefore we will try prazosin 1 mg p.o. nightly.  We can increase by 1 mg as tolerated.  She will let me know if she has any  side effects or if it is ineffective.  Second I will be happy to refer her to an audiologist given the high-frequency hearing loss.  Third, I will refill her  Keflex 250 mg p.o. daily as needed activity that leads to a urinary tract infection

## 2021-03-10 NOTE — Telephone Encounter (Signed)
She had a tampon fall out after using the bathroom. She said she thinks it has been in her for about 4-5 days. Had some sob from a previous virus, but want to know should she be concerned.

## 2021-04-12 ENCOUNTER — Encounter: Payer: Self-pay | Admitting: Family Medicine

## 2021-04-12 MED ORDER — CEPHALEXIN 250 MG PO CAPS
250.0000 mg | ORAL_CAPSULE | Freq: Every day | ORAL | 1 refills | Status: DC
Start: 2021-04-12 — End: 2021-06-22

## 2021-04-28 ENCOUNTER — Ambulatory Visit: Payer: Self-pay | Admitting: Family Medicine

## 2021-05-04 ENCOUNTER — Other Ambulatory Visit: Payer: Self-pay

## 2021-05-04 ENCOUNTER — Ambulatory Visit (INDEPENDENT_AMBULATORY_CARE_PROVIDER_SITE_OTHER): Admitting: Family Medicine

## 2021-05-04 VITALS — BP 102/70 | HR 98 | Temp 98.4°F | Resp 14 | Ht 67.0 in | Wt 145.0 lb

## 2021-05-04 DIAGNOSIS — R300 Dysuria: Secondary | ICD-10-CM | POA: Diagnosis not present

## 2021-05-04 LAB — URINALYSIS, ROUTINE W REFLEX MICROSCOPIC
Bacteria, UA: NONE SEEN /HPF
Bilirubin Urine: NEGATIVE
Glucose, UA: NEGATIVE
Hyaline Cast: NONE SEEN /LPF
Ketones, ur: NEGATIVE
Leukocytes,Ua: NEGATIVE
Nitrite: NEGATIVE
Protein, ur: NEGATIVE
Specific Gravity, Urine: 1.015 (ref 1.001–1.035)
WBC, UA: NONE SEEN /HPF (ref 0–5)
pH: 7.5 (ref 5.0–8.0)

## 2021-05-04 LAB — MICROSCOPIC MESSAGE

## 2021-05-04 MED ORDER — PHENAZOPYRIDINE HCL 95 MG PO TABS: 95.0000 mg | ORAL_TABLET | Freq: Three times a day (TID) | ORAL | 0 refills | Status: DC | PRN

## 2021-05-04 MED ORDER — SULFAMETHOXAZOLE-TRIMETHOPRIM 800-160 MG PO TABS: 1.0000 | ORAL_TABLET | Freq: Two times a day (BID) | ORAL | 0 refills | Status: DC

## 2021-05-04 NOTE — Progress Notes (Signed)
Subjective:    Patient ID: Susan Cooper, female    DOB: Jun 22, 1991, 30 y.o.   MRN: [id number]  HPI  Patient is a very pleasant 30 year old Caucasian female who presents today complaining of dysuria.  When I last saw her, she was reporting frequent urinary tract infections typically brought on by sexual intercourse.  At that time we started her on Keflex 250 mg tablets immediately after sex as a preventative.  Patient states that despite using that, she is still getting "bladder infections".  2 weeks ago she got what she felt was a bladder infection so she started taking the preventative frequently and attempt to treat it.  However despite taking this antibiotic for 2 weeks, she continues to have dysuria.  She also reports increased urinary urgency and hesitancy and frequency.  She states that she feels like she has to void however often she does not have to when she is in the restroom.  She has this persistent sensation that she needs to.  Urinalysis today shows only trace blood but is otherwise completely normal however the patient has been on antibiotics for the last 2 weeks Past Medical History:  Diagnosis Date  . Chronic kidney disease   . Kidney stone   . Urinary tract infection    Past Surgical History:  Procedure Laterality Date  . WISDOM TOOTH EXTRACTION     Current Outpatient Medications on File Prior to Visit  Medication Sig Dispense Refill  . cephALEXin (KEFLEX) 250 MG capsule Take 1 capsule (250 mg total) by mouth daily. 30 capsule 1  . prazosin (MINIPRESS) 1 MG capsule Take 1 capsule (1 mg total) by mouth at bedtime. 30 capsule 3   No current facility-administered medications on file prior to visit.   No Known Allergies Social History   Socioeconomic History  . Marital status: Married    Spouse name: Not on file  . Number of children: Not on file  . Years of education: Not on file  . Highest education level: Not on file  Occupational History  . Not on file   Tobacco Use  . Smoking status: Never  . Smokeless tobacco: Never  Vaping Use  . Vaping Use: Never used  Substance and Sexual Activity  . Alcohol use: Yes    Comment: occasional   . Drug use: No  . Sexual activity: Yes    Birth control/protection: None  Other Topics Concern  . Not on file  Social History Narrative  . Not on file   Social Determinants of Health   Financial Resource Strain: Not on file  Food Insecurity: Not on file  Transportation Needs: Not on file  Physical Activity: Not on file  Stress: Not on file  Social Connections: Not on file  Intimate Partner Violence: Not on file   Family History  Problem Relation Age of Onset  . Hematuria Paternal Grandmother   . Kidney failure Paternal Grandmother   . Kidney disease Paternal Grandfather   . Thyroid disease Mother   . Hypertension Mother   . Thyroid disease Maternal Grandmother   . Melanoma Paternal Aunt   . Hypertension Father   . Kidney cancer Neg Hx   . Breast cancer Neg Hx   . Ovarian cancer Neg Hx   . Colon cancer Neg Hx      Review of Systems     Objective:   Physical Exam Vitals reviewed.  Constitutional:      General: She is not in acute distress.  Appearance: Normal appearance. She is normal weight. She is not ill-appearing or toxic-appearing.  Cardiovascular:     Rate and Rhythm: Normal rate and regular rhythm.     Heart sounds: Normal heart sounds. No murmur heard.   No gallop.  Pulmonary:     Effort: Pulmonary effort is normal.     Breath sounds: Normal breath sounds.  Abdominal:     General: Bowel sounds are normal.     Palpations: Abdomen is soft.     Tenderness: There is no abdominal tenderness. There is no guarding.  Neurological:     Mental Status: She is alert.          Assessment & Plan:  Dysuria - Plan: Urinalysis, Routine w reflex microscopic, Urine Culture Patient states that there is no chance she could be pregnant.  Her last menstrual cycle was 2 days ago.   She denies any vaginal discharge.  She states that she has had yeast infections and this feels nothing like a yeast infection.  At this point, the patient either has a resistant urinary tract infection with an urinalysis that is a false negative.  Therefore I will send a urine culture to determine if there is an infection.  The other possibility would be interstitial cystitis.  Patient denies any particular foods such as acidic fruits that could be setting this off.  Symptoms usually occur after intercourse.  She denies any chemicals or lubricants or spermicides that could be irritating the urethral orifice.  Therefore, if urine culture is negative, I would recommend urology consultation for possible cystoscopy to evaluate for stigmata of interstitial cystitis and also to rule out anatomical abnormalities within the bladder that could predispose her to frequent urinary tract infections such as diverticuli or stones

## 2021-05-05 LAB — URINE CULTURE
MICRO NUMBER:: 12174805
SPECIMEN QUALITY:: ADEQUATE

## 2021-05-10 ENCOUNTER — Other Ambulatory Visit: Payer: Self-pay | Admitting: *Deleted

## 2021-05-10 DIAGNOSIS — N390 Urinary tract infection, site not specified: Secondary | ICD-10-CM

## 2021-05-10 DIAGNOSIS — R300 Dysuria: Secondary | ICD-10-CM

## 2021-05-10 DIAGNOSIS — R319 Hematuria, unspecified: Secondary | ICD-10-CM

## 2021-05-17 ENCOUNTER — Encounter: Payer: Self-pay | Admitting: Urology

## 2021-05-17 ENCOUNTER — Ambulatory Visit (INDEPENDENT_AMBULATORY_CARE_PROVIDER_SITE_OTHER): Admitting: Urology

## 2021-05-17 ENCOUNTER — Other Ambulatory Visit: Payer: Self-pay

## 2021-05-17 VITALS — BP 105/66 | HR 62 | Ht 67.0 in | Wt 143.0 lb

## 2021-05-17 DIAGNOSIS — N390 Urinary tract infection, site not specified: Secondary | ICD-10-CM | POA: Diagnosis not present

## 2021-05-17 DIAGNOSIS — R102 Pelvic and perineal pain: Secondary | ICD-10-CM | POA: Diagnosis not present

## 2021-05-17 LAB — BLADDER SCAN AMB NON-IMAGING

## 2021-05-17 MED ORDER — DOXYCYCLINE HYCLATE 100 MG PO CAPS: 100.0000 mg | ORAL_CAPSULE | Freq: Two times a day (BID) | ORAL | 0 refills | Status: DC

## 2021-05-17 MED ORDER — CELECOXIB 200 MG PO CAPS: 200.0000 mg | ORAL_CAPSULE | Freq: Two times a day (BID) | ORAL | 0 refills | Status: DC

## 2021-05-17 NOTE — Progress Notes (Signed)
d  05/17/21 3:26 PM   Angus Seller 07-29-1991 [id number]  CC: Dysuria, urinary symptoms, pelvic pain, possible recurrent UTIs  HPI: I saw Ms. Ran in clinic for the above issues.  She is a 30 year old female with a very long history of the above symptoms since at least high school.  They have been worsening over the last few months.  She has pain with sexual activity, and UTI symptoms of dysuria and urgency and urge incontinence.  She drinks 1 diet energy drink during the day.  She has no urinary symptoms overnight.  She recently went through a divorce.  Recent urinalysis and urine culture with PCP at the end of July was benign.  She also has pain with sexual activity.  In the distant past she was on a low-dose prophylactic antibiotic which she felt helped initially with her urinary symptoms.  She denies any gross hematuria.  Urinalysis today suspicious for infection with 11-30 WBCs, moderate bacteria, 2+ leukocytes, nitrite negative.  PVR normal at 0 mL.   PMH: Past Medical History:  Diagnosis Date   Chronic kidney disease    Kidney stone    Urinary tract infection     Surgical History: Past Surgical History:  Procedure Laterality Date   WISDOM TOOTH EXTRACTION        Family History: Family History  Problem Relation Age of Onset   Hematuria Paternal Grandmother    Kidney failure Paternal Grandmother    Kidney disease Paternal Grandfather    Thyroid disease Mother    Hypertension Mother    Thyroid disease Maternal Grandmother    Melanoma Paternal Aunt    Hypertension Father    Kidney cancer Neg Hx    Breast cancer Neg Hx    Ovarian cancer Neg Hx    Colon cancer Neg Hx     Social History:  reports that she has never smoked. She has never used smokeless tobacco. She reports current alcohol use. She reports that she does not use drugs.  Physical Exam: BP 105/66   Pulse 62   Ht '5\' 7"'$  (1.702 m)   Wt 143 lb (64.9 kg)   BMI 22.40 kg/m    Constitutional:  Alert  and oriented, No acute distress. Cardiovascular: No clubbing, cyanosis, or edema. Respiratory: Normal respiratory effort, no increased work of breathing. GI: Abdomen is soft, nontender, nondistended, no abdominal masses   Laboratory Data: Reviewed, see HPI  Pertinent Imaging: I have personally viewed and interpreted the renal ultrasound from 2019 showing no hydronephrosis but a possible 3 mm right nonobstructive stone.  Assessment & Plan:   30 year old female with long history of dysuria, dyspareunia, urgency, pelvic pain, and some urge incontinence of unclear etiology.  Urinalysis today suspicious for infection.  We discussed the complexities of pelvic pain and possible range of etiologies including recurrent UTIs, urethral diverticulum, pelvic floor dysfunction, chronic bladder pain syndrome, and interstitial cystitis.  We reviewed the AUA guidelines that recommend an algorithmic approach to treatment for these patients, and that a trial of different medications and strategies is sometimes needed to find the approach that works best for each patient's unique situation.  I reinforced the importance of stress management, relaxation, avoiding triggers, and pain management in the approach to pelvic pain.  Behavioral strategies discussed regarding avoiding bladder irritants, stress/anxiety Urine sent for culture and atypicals, doxycycline started Trial of Celebrex x2 weeks RTC 1 month symptom check, pelvic exam at that time if persistent symptoms Consider further upper tract imaging with ultrasound  or CT, or cystoscopy at next visit, versus trial of low-dose nightly amitriptyline  Nickolas Madrid, MD 05/17/2021  [facility] [address] [place], [place] [address] [phone]

## 2021-05-17 NOTE — Patient Instructions (Signed)
Pelvic Floor Dysfunction  Pelvic floor dysfunction (PFD) is a condition that results when the group of muscles and connective tissues that support the organs in the pelvis (pelvic floor muscles) do not work well. These muscles and their connections form a sling that supports the colon and bladder. In men, these muscles also support the prostategland. In women, they also support the uterus. PFD causes pelvic floor muscles to be too weak, too tight, or a combination of both. In PFD, muscle movements are not coordinated. This condition may causebowel or bladder problems. It may also cause pain. What are the causes? This condition may be caused by an injury to the pelvic area or by a weakening of pelvic muscles. This often results from pregnancy and childbirth or othertypes of strain. In many cases, the exact cause is not known. What increases the risk? The following factors may make you more likely to develop this condition: Having a condition of chronic bladder tissue inflammation (interstitial cystitis). Being an older person. Being overweight. Radiation treatment for cancer in the pelvic region. Previous pelvic surgery, such as removal of the uterus (hysterectomy) or prostate gland (prostatectomy). What are the signs or symptoms? Symptoms of this condition vary and may include: Bladder symptoms, such as: Trouble starting urination and emptying the bladder. Frequent urinary tract infections. Leaking urine when coughing, laughing, or exercising (stress incontinence). Having to pass urine urgently or frequently. Pain when passing urine. Bowel symptoms, such as: Constipation. Urgent or frequent bowel movements. Incomplete bowel movements. Painful bowel movements. Leaking stool or gas. Unexplained genital or rectal pain. Genital or rectal muscle spasms. Low back pain. In women, symptoms of PFD may also include: A heavy, full, or aching feeling in the vagina. A bulge that protrudes into the  vagina. Pain during or after sexual intercourse. How is this diagnosed? This condition may be diagnosed based on: Your symptoms and medical history. A physical exam. During the exam, your health care provider may check your pelvic muscles for tightness, spasm, pain, or weakness. This may include a rectal exam and a pelvic exam for women. In some cases, you may have diagnostic tests, such as: Electrical muscle function tests. Urine flow testing. X-ray tests of bowel function. Ultrasound of the pelvic organs. How is this treated? Treatment for this condition depends on your symptoms. Treatment options include: Physical therapy. This may include Kegel exercises to help relax or strengthen the pelvic floor muscles. Biofeedback. This type of therapy provides feedback on how tight your pelvic floor muscles are so that you can learn to control them. Internal or external massage therapy. A treatment that involves electrical stimulation of the pelvic floor muscles to help control pain (transcutaneous electrical nerve stimulation, or TENS). Sound wave therapy (ultrasound) to reduce muscle spasms. Medicines, such as: Muscle relaxants. Bladder control medicines. Surgery to reconstruct or support pelvic floor muscles may be an option ifother treatments do not help. Follow these instructions at home: Activity Do your usual activities as told by your health care provider. Ask your health care provider if you should modify any activities. Do pelvic floor strengthening or relaxing exercises at home as told by your physical therapist. Lifestyle Maintain a healthy weight. Eat foods that are high in fiber, such as beans, whole grains, and fresh fruits and vegetables. Limit foods that are high in fat and processed sugars, such as fried or sweet foods. Manage stress with relaxation techniques such as yoga or meditation. General instructions If you have problems with leakage: Use absorbable pads  or wear  padded underwear. Wash frequently with mild soap. Keep your genital and anal area as clean and dry as possible. Ask your health care provider if you should try a barrier cream to prevent skin irritation. Take warm baths to relieve pelvic muscle tension or spasms. Take over-the-counter and prescription medicines only as told by your health care provider. Keep all follow-up visits as told by your health care provider. This is important. Contact a health care provider if you: Are not improving with home care. Have signs or symptoms of PFD that get worse at home. Develop new signs or symptoms at home. Have signs of a urinary tract infection, such as: Fever. Chills. Urinary frequency. A burning feeling when urinating. Have not had a bowel movement in 3 days (constipation). Summary Pelvic floor dysfunction results when the muscles and connective tissues in your pelvic floor do not work well. These muscles and their connections form a sling that supports your colon and bladder. In men, these muscles also support the prostate gland. In women, they also support the uterus. PFD may be caused by an injury to the pelvic area or by a weakening of pelvic muscles. PFD causes pelvic floor muscles to be too weak, too tight, or a combination of both. Symptoms may vary from person to person. In most cases, PFD can be treated with physical therapies and medicines. Surgery may be an option if other treatments do not help. This information is not intended to replace advice given to you by your health care provider. Make sure you discuss any questions you have with your healthcare provider. Document Revised: 04/14/2018 Document Reviewed: 04/14/2018 Elsevier Patient Education  [place].  Interstitial Cystitis  Interstitial cystitis is inflammation of the bladder. This condition is also known as painful bladder syndrome. This may cause pain in the bladder area as well as a frequent and urgent need to  urinate. The bladder is an organ thatstores urine after the urine is made in the kidneys. The severity of interstitial cystitis can vary from person to person. You may have flare-ups, and then your symptoms may go away for a while. For many people, it becomes a long-term (chronic) problem. What are the causes? The cause of this condition is not known. What increases the risk? The following factors may make you more likely to develop this condition: You are female. You have fibromyalgia. You have irritable bowel syndrome (IBS). You have endometriosis. This condition may be aggravated by: Stress. Smoking. Spicy foods. What are the signs or symptoms? Symptoms of interstitial cystitis vary, and they can change over time. Symptoms may include: Discomfort or pain in the bladder area, which is in the lower abdomen. Pain can range from mild to severe. The pain may change in intensity as the bladder fills with urine or as it empties. Pain in the pelvic area, between the hip bones. An urgent need to urinate. Frequent urination. Pain during urination. Pain during sex. Blood in the urine. Fatigue. For women, symptoms often get worse during menstruation. How is this diagnosed? This condition is diagnosed based on your symptoms, your medical history, and a physical exam. You may have tests to rule out other conditions, such as: Urine tests. Cystoscopy. For this test, a tool similar to a very thin telescope is used to look into your bladder. Biopsy. This involves taking a sample of tissue from the bladder to be examined under a microscope. How is this treated? There is no cure for this condition, but  treatment can help you control your symptoms. Work closely with your health care provider to find the most effective treatments for you. Treatment options may include: Medicines to relieve pain and reduce how often you feel the need to urinate. This treatment may include: A procedure where a small  amount of medicine that eases irritation is put inside your bladder through a catheter (bladder instillation). Lifestyle changes, such as changing your diet or taking steps to control stress. Physical therapy. This may include: Exercises to help relax the pelvic floor muscles. Massage to relax tight muscles (myofascial release). Learning ways to control when you urinate (bladder training). Using a device that provides electrical stimulation to your nerves, which can relieve pain (neuromodulation therapy). The device is placed on your back, where it blocks the nerves that cause you to feel pain in your bladder area. A procedure that stretches your bladder by filling it with air or fluid (hydrodistention). Surgery. This is rare. It is only done for extreme cases, if other treatments do not help. Follow these instructions at home: Lifestyle Learn and practice relaxation techniques, such as deep breathing and muscle relaxation. Get care for your body and mental well-being, such as: Cognitive behavioral therapy (CBT). This therapy changes the way you think or act in response to the fatigue. This may help improve how you feel. Seeing a mental health therapist to evaluate and treat depression, if necessary. Work with your health care provider on other ways to manage pain. Acupuncture may be helpful. Avoid drinking alcohol. Do not use any products that contain nicotine or tobacco, such as cigarettes, e-cigarettes, and chewing tobacco. If you need help quitting, ask your health care provider. Eating and drinking Make dietary changes as recommended by your health care provider. You may need to avoid: Spicy foods. Foods that contain a lot of potassium. Limit your intake of drinks that make you need to urinate. These include: Caffeinated drinks like soda, coffee, and tea. Alcohol. Bladder training  Use bladder training techniques as directed. Techniques may include: Urinating at scheduled  times. Training yourself to delay urination. Keep a bladder diary. Write down the times that you urinate and any symptoms that you have. This can help you find out which foods, liquids, or activities make your symptoms worse. Use your bladder diary to schedule bathroom trips. If you are away from home, plan to be near a bathroom at each of your scheduled times. Make sure that you urinate just before you leave the house and just before you go to bed.  General instructions Take over-the-counter and prescription medicines only as told by your health care provider. You can try a warm or cool compress over your bladder for comfort. Avoid wearing tight clothing. Do exercises to relax your pelvic floor muscles as told by your physical therapist. Keep all follow-up visits as told by your health care provider. This is important. Where to find more information To find more information or a support group near you, visit: [facility] [facility]: urologyhealth.org Interstitial Cystitis Association: ClassPreviews.com.br Contact a health care provider if you have: Symptoms that do not get better with treatment. Pain or discomfort that gets worse. More frequent urges to urinate. A fever. Get help right away if: You have no control over when you urinate. Summary Interstitial cystitis is inflammation of the bladder. This condition may cause pain in the bladder area as well as a frequent and urgent need to urinate. You may have flare-ups of the condition, and then it may go  away for a while. For many people, it becomes a long-term (chronic) problem. There is no cure for interstitial cystitis, but treatment methods are available to control your symptoms. This information is not intended to replace advice given to you by your health care provider. Make sure you discuss any questions you have with your healthcare provider. Document Revised: 11/11/2019 Document Reviewed: 08/19/2017 Elsevier Patient Education  2021  [place] for Interstitial Cystitis Interstitial cystitis (IC) is a long-term (chronic) condition that causes pain and pressure in the bladder, the lower abdomen, and the pelvic area. Other symptoms of IC include urinary urgency andfrequency. Symptoms tend to come and go. Many people with IC find that certain foods trigger their symptoms. Different foods may be problematic for different people. Some foods are more likely to cause symptoms than others. Learning which foods bother you and which do notcan help you come up with an eating plan to manage IC. What are tips for following this plan? You may find it helpful to work with a dietitian. This health care provider can help you develop your eating plan by doing an elimination diet, which involves these steps: Start with a list of foods that you think trigger your IC symptoms along with the foods that most commonly trigger symptoms for many people with IC. Eliminate those foods from your diet for about one month, then start reintroducing the foods one at a time to see which ones trigger your symptoms. Make a list of the foods that trigger your symptoms. It may take several months to find out which foods bother you. Reading food labels Once you know which foods trigger your IC symptoms, you can avoid them. However, it is also a good idea to read food labels because some foods that trigger your symptoms may be included as ingredients in other foods. These ingredients may include: Chili peppers. Tomato products. Soy. Worcestershire sauce. Vinegar. Alcohol. Citrus flavors or juices. Artificial sweeteners. Monosodium glutamate. Shopping Shopping can be a challenge if many foods trigger your IC. When you go grocery shopping, bring a list of the foods you can eat. You can get an app for your phone that lets you know which foods are the safest and which you may want to avoid. You can find the app at the Interstitial Cystitis Network  website: www.ic-network.com Meal planning Plan your meals according to the results of your elimination diet. If you have not done an elimination diet, plan meals according to IC food lists recommended by your health care provider or dietitian. These lists tell you which foods are least and most likely to cause symptoms. Avoid certain types of food when you go out to eat, such as pizza and foods typically served at [place], [place], and [place]. These foods often contain ingredients that can aggravate IC. General information Here are some general guidelines for an IC eating plan: Do not eat large portions. Drink plenty of fluids with your meals. Do not eat foods that are high in sugar, salt, or saturated fat. Choose whole fruits instead of juice. Eat a colorful variety of vegetables. What foods should I eat? For people with IC, the best diet is a balanced one that includes things from all the food groups. Even if you have to avoid certain foods, there are still plenty of healthy choices in each group. The following are some foods that areleast bothersome and may be safest to eat: Fruits Bananas. Blueberries and blueberry juice. Melons. Pears. Apples. Dates. Prunes.Raisins.  Apricots. Vegetables Asparagus. Avocado. Celery. Beets. Bell peppers. Black olives. Broccoli. Brussels sprouts. Cabbage. Carrots. Cauliflower. Cucumber. Eggplant. Greenbeans. Potatoes. Radishes. Spinach. Squash. Turnips. Zucchini. Mushrooms. Peas. Grains Oats. Rice. Bran. Oatmeal. Whole wheat bread. Meats and other proteins Beef. Fish and other seafood. Eggs. Nuts. Peanut butter. Pork. Poultry. Lamb.Garbanzo beans. Pinto beans. Dairy Whole or low-fat milk. American, mozzarella, mild cheddar, feta, ricotta, andcream cheeses. The items listed above may not be a complete list of foods and beverages you can eat. Contact a dietitian for more information. What foods should I avoid? You should avoid any foods that seem to  trigger your symptoms. It is also a good idea to avoid foods that are most likely to cause symptoms in many peoplewith IC. These include the following: Fruits Citrus fruits, including lemons, limes, oranges, and grapefruit. Cranberries.Strawberries. Pineapple. Kiwi. Vegetables Chili peppers. Onions. Sauerkraut. Tomato and tomato products. Angie Fava. Grains You do not need to avoid any type of grain unless it triggers your symptoms. Meats and other proteins Precooked or cured meats, such as sausages or meat loaves. Soy products. Dairy Chocolate ice cream. Processed cheese. Yogurt. Beverages Alcohol. Chocolate drinks. Coffee. Cranberry juice. Carbonated drinks. Tea(black, green, or herbal). Tomato juice. Sports drinks. The items listed above may not be a complete list of foods and beverages you should avoid. Contact a dietitian for more information. Summary Many people with IC find that certain foods trigger their symptoms. Different foods may be problematic for different people. Some foods are more likely to cause symptoms than others. You may find it helpful to work with a dietitian to do an elimination diet and come up with an eating plan that is right for you. Plan your meals according to the results of your elimination diet. If you have not done an elimination diet, plan your meals using IC food lists. These lists tell you which foods are least and most likely to cause symptoms. The best diet for people with IC is a balanced diet that includes foods from all the food groups. Even if you have to avoid certain foods, there are still plenty of healthy choices in each group. This information is not intended to replace advice given to you by your health care provider. Make sure you discuss any questions you have with your healthcare provider. Document Revised: 01/15/2019 Document Reviewed: 05/29/2018 Elsevier Patient Education  2022 [employer].

## 2021-05-18 LAB — URINALYSIS, COMPLETE
Bilirubin, UA: NEGATIVE
Glucose, UA: NEGATIVE
Nitrite, UA: NEGATIVE
Protein,UA: NEGATIVE
RBC, UA: NEGATIVE
Specific Gravity, UA: 1.015 (ref 1.005–1.030)
Urobilinogen, Ur: 0.2 mg/dL (ref 0.2–1.0)
pH, UA: 7.5 (ref 5.0–7.5)

## 2021-05-18 LAB — MICROSCOPIC EXAMINATION: RBC, Urine: NONE SEEN /hpf (ref 0–2)

## 2021-05-20 LAB — CULTURE, URINE COMPREHENSIVE

## 2021-05-25 LAB — MYCOPLASMA / UREAPLASMA CULTURE
Mycoplasma hominis Culture: NEGATIVE
Ureaplasma urealyticum: NEGATIVE

## 2021-06-22 ENCOUNTER — Encounter: Payer: Self-pay | Admitting: Urology

## 2021-06-22 ENCOUNTER — Other Ambulatory Visit: Payer: Self-pay

## 2021-06-22 ENCOUNTER — Ambulatory Visit (INDEPENDENT_AMBULATORY_CARE_PROVIDER_SITE_OTHER): Admitting: Urology

## 2021-06-22 VITALS — BP 107/68 | HR 67 | Ht 67.0 in | Wt 138.0 lb

## 2021-06-22 DIAGNOSIS — R102 Pelvic and perineal pain: Secondary | ICD-10-CM

## 2021-06-22 DIAGNOSIS — M6289 Other specified disorders of muscle: Secondary | ICD-10-CM | POA: Diagnosis not present

## 2021-06-22 MED ORDER — AMITRIPTYLINE HCL 25 MG PO TABS: 25.0000 mg | ORAL_TABLET | Freq: Every day | ORAL | 3 refills | Status: DC

## 2021-06-22 NOTE — Progress Notes (Signed)
   06/22/2021 2:50 PM   SEVERA SCHMAL 08-18-1991 [id number]  Reason for visit: Follow up dysuria, pelvic pain  HPI: Healthy and active 30 year old female with a long history of pelvic pain and dysuria since at least high school.  This has been worse over the last few months, and seems to be worse after sexual activity, and if she drinks alcohol.  In between these episodes she tends to do pretty well.  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  At our last visit, both standard urine culture and atypicals were negative.  We trialed 2 weeks of Celebrex and a course of doxycycline for possible atypical infection prior to culture returning.  She does not notice any improvement after taking those medications.  I also referred her to pelvic floor physical therapy, but she has not heard from them yet.  On pelvic exam today with [facility] as chaperone the vaginal and urethral tissue are healthy appearing, inferior aspect of urethra tender but no obvious lesions or diverticulum.  We discussed the complexities of pelvic pain and possible range of etiologies including pelvic floor dysfunction, chronic bladder pain syndrome, and interstitial cystitis.  We reviewed the AUA guidelines that recommend an algorithmic approach to treatment for these patients, and that a trial of different medications and strategies is sometimes needed to find the approach that works best for each patient's unique situation.  I reinforced the importance of stress management, relaxation, avoiding triggers, and pain management in the approach to pelvic pain.  I think she would benefit significantly from referral to pelvic floor physical therapy, and I reached out to one of the therapists to try to get her in.  I also think is reasonable to try low-dose amitriptyline 25 mg daily until she is seen by PT  Follow-up urinalysis and culture from today RTC with me 4 months symptom check   Billey Co, MD  [place] [address] [place], [place] [address] [phone]

## 2021-06-22 NOTE — Patient Instructions (Signed)
Pelvic Floor Dysfunction Pelvic floor dysfunction (PFD) is a condition that results when the group of muscles and connective tissues that support the organs in the pelvis (pelvic floor muscles) do not work well. These muscles and their connections form a sling that supports the colon and bladder. In men, these muscles also support the prostate gland. In women, they also support the uterus. PFD causes pelvic floor muscles to be too weak, too tight, or a combination of both. In PFD, muscle movements are not coordinated. This condition may cause bowel or bladder problems. It may also cause pain. What are the causes? This condition may be caused by an injury to the pelvic area or by a weakening of pelvic muscles. This often results from pregnancy and childbirth or other types of strain. In many cases, the exact cause is not known. What increases the risk? The following factors may make you more likely to develop this condition: Having a condition of chronic bladder tissue inflammation (interstitial cystitis). Being an older person. Being overweight. Radiation treatment for cancer in the pelvic region. Previous pelvic surgery, such as removal of the uterus (hysterectomy) or prostate gland (prostatectomy). What are the signs or symptoms? Symptoms of this condition vary and may include: Bladder symptoms, such as: Trouble starting urination and emptying the bladder. Frequent urinary tract infections. Leaking urine when coughing, laughing, or exercising (stress incontinence). Having to pass urine urgently or frequently. Pain when passing urine. Bowel symptoms, such as: Constipation. Urgent or frequent bowel movements. Incomplete bowel movements. Painful bowel movements. Leaking stool or gas. Unexplained genital or rectal pain. Genital or rectal muscle spasms. Low back pain. In women, symptoms of PFD may also include: A heavy, full, or aching feeling in the vagina. A bulge that protrudes into  the vagina. Pain during or after sexual intercourse. How is this diagnosed? This condition may be diagnosed based on: Your symptoms and medical history. A physical exam. During the exam, your health care provider may check your pelvic muscles for tightness, spasm, pain, or weakness. This may include a rectal exam and a pelvic exam for women. In some cases, you may have diagnostic tests, such as: Electrical muscle function tests. Urine flow testing. X-ray tests of bowel function. Ultrasound of the pelvic organs. How is this treated? Treatment for this condition depends on your symptoms. Treatment options include: Physical therapy. This may include Kegel exercises to help relax or strengthen the pelvic floor muscles. Biofeedback. This type of therapy provides feedback on how tight your pelvic floor muscles are so that you can learn to control them. Internal or external massage therapy. A treatment that involves electrical stimulation of the pelvic floor muscles to help control pain (transcutaneous electrical nerve stimulation, or TENS). Sound wave therapy (ultrasound) to reduce muscle spasms. Medicines, such as: Muscle relaxants. Bladder control medicines. Surgery to reconstruct or support pelvic floor muscles may be an option if other treatments do not help. Follow these instructions at home: Activity Do your usual activities as told by your health care provider. Ask your health care provider if you should modify any activities. Do pelvic floor strengthening or relaxing exercises at home as told by your physical therapist. Lifestyle Maintain a healthy weight. Eat foods that are high in fiber, such as beans, whole grains, and fresh fruits and vegetables. Limit foods that are high in fat and processed sugars, such as fried or sweet foods. Manage stress with relaxation techniques such as yoga or meditation. General instructions If you have problems with leakage:  Use absorbable pads or  wear padded underwear. Wash frequently with mild soap. Keep your genital and anal area as clean and dry as possible. Ask your health care provider if you should try a barrier cream to prevent skin irritation. Take warm baths to relieve pelvic muscle tension or spasms. Take over-the-counter and prescription medicines only as told by your health care provider. Keep all follow-up visits as told by your health care provider. This is important. Contact a health care provider if you: Are not improving with home care. Have signs or symptoms of PFD that get worse at home. Develop new signs or symptoms at home. Have signs of a urinary tract infection, such as: Fever. Chills. Urinary frequency. A burning feeling when urinating. Have not had a bowel movement in 3 days (constipation). Summary Pelvic floor dysfunction results when the muscles and connective tissues in your pelvic floor do not work well. These muscles and their connections form a sling that supports your colon and bladder. In men, these muscles also support the prostate gland. In women, they also support the uterus. PFD may be caused by an injury to the pelvic area or by a weakening of pelvic muscles. PFD causes pelvic floor muscles to be too weak, too tight, or a combination of both. Symptoms may vary from person to person. In most cases, PFD can be treated with physical therapies and medicines. Surgery may be an option if other treatments do not help. This information is not intended to replace advice given to you by your health care provider. Make sure you discuss any questions you have with your health care provider. Document Revised: 04/14/2018 Document Reviewed: 04/14/2018 Elsevier Patient Education  [employer] [employer].   Interstitial Cystitis Interstitial cystitis is inflammation of the bladder. This condition is also known as painful bladder syndrome. This may cause pain in the bladder area as well as a frequent and urgent  need to urinate. The bladder is an organ that stores urine after the urine is made in the kidneys. The severity of interstitial cystitis can vary from person to person. You may have flare-ups, and then your symptoms may go away for a while. For many people, it becomes a long-term (chronic) problem. What are the causes? The cause of this condition is not known. What increases the risk? The following factors may make you more likely to develop this condition: You are female. You have fibromyalgia. You have irritable bowel syndrome (IBS). You have endometriosis. This condition may be aggravated by: Stress. Smoking. Spicy foods. What are the signs or symptoms? Symptoms of interstitial cystitis vary, and they can change over time. Symptoms may include: Discomfort or pain in the bladder area, which is in the lower abdomen. Pain can range from mild to severe. The pain may change in intensity as the bladder fills with urine or as it empties. Pain in the pelvic area, between the hip bones. An urgent need to urinate. Frequent urination. Pain during urination. Pain during sex. Blood in the urine. Fatigue. For women, symptoms often get worse during menstruation. How is this diagnosed? This condition is diagnosed based on your symptoms, your medical history, and a physical exam. You may have tests to rule out other conditions, such as: Urine tests. Cystoscopy. For this test, a tool similar to a very thin telescope is used to look into your bladder. Biopsy. This involves taking a sample of tissue from the bladder to be examined under a microscope. How is this treated? There is no  cure for this condition, but treatment can help you control your symptoms. Work closely with your health care provider to find the most effective treatments for you. Treatment options may include: Medicines to relieve pain and reduce how often you feel the need to urinate. This treatment may include: A procedure where a  small amount of medicine that eases irritation is put inside your bladder through a catheter (bladder instillation). Lifestyle changes, such as changing your diet or taking steps to control stress. Physical therapy. This may include: Exercises to help relax the pelvic floor muscles. Massage to relax tight muscles (myofascial release). Learning ways to control when you urinate (bladder training). Using a device that provides electrical stimulation to your nerves, which can relieve pain (neuromodulation therapy). The device is placed on your back, where it blocks the nerves that cause you to feel pain in your bladder area. A procedure that stretches your bladder by filling it with air or fluid (hydrodistention). Surgery. This is rare. It is only done for extreme cases, if other treatments do not help. Follow these instructions at home: Lifestyle Learn and practice relaxation techniques, such as deep breathing and muscle relaxation. Get care for your body and mental well-being, such as: Cognitive behavioral therapy (CBT). This therapy changes the way you think or act in response to the fatigue. This may help improve how you feel. Seeing a mental health therapist to evaluate and treat depression, if necessary. Work with your health care provider on other ways to manage pain. Acupuncture may be helpful. Avoid drinking alcohol. Do not use any products that contain nicotine or tobacco, such as cigarettes, e-cigarettes, and chewing tobacco. If you need help quitting, ask your health care provider. Eating and drinking Make dietary changes as recommended by your health care provider. You may need to avoid: Spicy foods. Foods that contain a lot of potassium. Limit your intake of drinks that make you need to urinate. These include: Caffeinated drinks like soda, coffee, and tea. Alcohol. Bladder training  Use bladder training techniques as directed. Techniques may include: Urinating at scheduled  times. Training yourself to delay urination. Keep a bladder diary. Write down the times that you urinate and any symptoms that you have. This can help you find out which foods, liquids, or activities make your symptoms worse. Use your bladder diary to schedule bathroom trips. If you are away from home, plan to be near a bathroom at each of your scheduled times. Make sure that you urinate just before you leave the house and just before you go to bed. General instructions Take over-the-counter and prescription medicines only as told by your health care provider. You can try a warm or cool compress over your bladder for comfort. Avoid wearing tight clothing. Do exercises to relax your pelvic floor muscles as told by your physical therapist. Keep all follow-up visits as told by your health care provider. This is important. Where to find more information To find more information or a support group near you, visit: [facility]: [facility]org Interstitial Cystitis Association: [email or web address] Contact a health care provider if you have: Symptoms that do not get better with treatment. Pain or discomfort that gets worse. More frequent urges to urinate. A fever. Get help right away if: You have no control over when you urinate. Summary Interstitial cystitis is inflammation of the bladder. This condition may cause pain in the bladder area as well as a frequent and urgent need to urinate. You may have flare-ups of the condition, and  then it may go away for a while. For many people, it becomes a long-term (chronic) problem. There is no cure for interstitial cystitis, but treatment methods are available to control your symptoms. This information is not intended to replace advice given to you by your health care provider. Make sure you discuss any questions you have with your health care provider. Document Revised: 11/11/2019 Document Reviewed: 08/19/2017 Elsevier Patient Education  2021  [employer].

## 2021-06-23 LAB — MICROSCOPIC EXAMINATION: Bacteria, UA: NONE SEEN

## 2021-06-23 LAB — URINALYSIS, COMPLETE
Bilirubin, UA: NEGATIVE
Glucose, UA: NEGATIVE
Ketones, UA: NEGATIVE
Nitrite, UA: NEGATIVE
Protein,UA: NEGATIVE
Specific Gravity, UA: 1.01 (ref 1.005–1.030)
Urobilinogen, Ur: 0.2 mg/dL (ref 0.2–1.0)
pH, UA: 6.5 (ref 5.0–7.5)

## 2021-07-04 ENCOUNTER — Other Ambulatory Visit: Payer: Self-pay

## 2021-07-04 ENCOUNTER — Encounter: Payer: Self-pay | Admitting: Physical Therapy

## 2021-07-04 ENCOUNTER — Other Ambulatory Visit: Payer: Self-pay | Admitting: Urology

## 2021-07-04 ENCOUNTER — Ambulatory Visit: Attending: Urology | Admitting: Physical Therapy

## 2021-07-04 DIAGNOSIS — R102 Pelvic and perineal pain: Secondary | ICD-10-CM | POA: Insufficient documentation

## 2021-07-04 DIAGNOSIS — M62838 Other muscle spasm: Secondary | ICD-10-CM | POA: Diagnosis present

## 2021-07-04 DIAGNOSIS — R278 Other lack of coordination: Secondary | ICD-10-CM

## 2021-07-04 NOTE — Therapy (Signed)
[facility] [address] [place], [place], [address] Phone: [phone]   Fax:  [phone]  Physical Therapy Evaluation  Patient Details  Name: Susan Cooper MRN: [id number] Date of Birth: Sep 08, 1991 Referring Provider (PT): Diamantina Providence   Encounter Date: 07/04/2021   PT End of Session - 07/04/21 1325     Visit Number 1    Number of Visits 12    Date for PT Re-Evaluation 09/26/21    Authorization Type IE 07/04/2021    PT Start Time 1330    PT Stop Time 1410    PT Time Calculation (min) 40 min    Activity Tolerance Patient tolerated treatment well    Behavior During Therapy [facility] for tasks assessed/performed             Past Medical History:  Diagnosis Date   Chronic kidney disease    Kidney stone    Urinary tract infection     Past Surgical History:  Procedure Laterality Date   WISDOM TOOTH EXTRACTION      There were no vitals filed for this visit.        [facility] PT Assessment - 07/04/21 1323       Assessment   Medical Diagnosis pelvic floor dysfunction    Referring Provider (PT) Sninsky    Hand Dominance Left    Next MD Visit 10/26/2021    Prior Therapy None for this dx      Balance Screen   Has the patient fallen in the past 6 months No             PELVIC HEALTH PHYSICAL THERAPY EVALUATION  SCREENING Red Flags: None Have you had any night sweats? Unexplained weight loss? Saddle anesthesia? Unexplained changes in bowel or bladder habits?   SUBJECTIVE  Chief Complaint: Patient notes that she has a longstanding history (since high school) of bladder/urinary issues. Notes kidney stones and UTIs in high school and has just progressed since then to dealing with regular UTIs, pain, urinary urgency, frequency, dysuria, dyspareunia. Patient notes that historically she has had increased pain in the presence of infection and frequent infections, but pain between episodes would be well controlled.  Currently, she notes that pain is more consistent. Patient states that she does feel worried about her symptoms and has some level of fear avoidance with painful activities (urination, sexual activity). The past two nights she has had increased pain at the suprapubic and vaginal areas; notes inflammation in the area and has been using heat to modulate pain at night.  Patient is presently in pain at the vaginal opening and at the suprapubic region. She notes that she travelled this past weekend and after sexual activity she had pain. Notes inability to participate for longer period of time and upon trying to return to sexual activity had feeling of "sandpaper" like a "carpet burn". Patient notes she has continued to have this pain until now; is concerned for yeast infection or some type of infection 2/2 to the discomfort and burning sensation.  Patient worked as a Pharmacist, hospital previously and notes she needed to delay voiding for longer periods of time. 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   Patient notes no straining with BMs but does note rare occurrence of hematochezia.  Pertinent History:  Falls Negative.  Scoliosis Negative. Pulmonary disease/dysfunction Negative. Surgical history: Positive for see above.   Recent Procedures/Tests/Findings: PVR 13mL  Obstetrical History: G0P0  Gynecological History: Hysterectomy: No  Endometriosis: negative, but notes "terrible cramps" when menstruating Pelvic Organ Prolapse: Negative Last Menstrual Period: 05/02/2021 Pain with exam: Yes (posterior fourchette) Heaviness/pressure: No    Urinary History: Incontinence: Positive. Onset: 2020 Triggers: sneezing Amount: Min. Protective undergarments: No  Fluid Intake: ~80 oz H20, coffee with sugar free creamer, 1 monster caffeinated, occasional coke zero sodas Nocturia: 1-2x/night Frequency of urination: 5-8x/day/ every 2 hours Toileting posture: heels  elevated, occasional use of stool Pain with urination: Positive (flare up 100%) Difficulty initiating urination: Positive for 100% with flare up Intermittent stream: Positive for with flare up Frequent UTI: Positive.   Gastrointestinal History: [place] Stool Chart: Type 4 Frequency of BMs: 1-2x/day; and at time s every other day Pain with defecation: Negative Straining with defecation: Negative Hemorrhoids: Negative  Sexual activity/pain: Pain with intercourse: Positive.   Initial penetration: Yes (2nd+ attempt) Deep thrusting: Yes External stimulation: No Able to achieve orgasm: Yes; not consistently when in pain.  Location of pain: vaginal opening, suprapubic Current pain:  6/10  Max pain:  7-8/10 (7 at night, 8 with initiation of void) Least pain:  4/10 Pain quality: pain quality: burning and tension Radiating pain: No   Current activities:  Desk job, gym, travel   [place] Patient is oriented to person, place and time.  Recent memory is intact.  Remote memory is intact.  Attention span and concentration are intact.  Expressive speech is intact.  Patient's fund of knowledge is within normal limits for educational level.  POSTURE/OBSERVATIONS:  Lumbar lordosis: increased Thoracic kyphosis: WNL Sitting posture: rounded shoulders, increased hip adduction, heels elevated  GAIT: Grossly WNL  RANGE OF MOTION: deferred 2/2 to time constraints   LEFT RIGHT  Lumbar forward flexion (65):      Lumbar extension (30):     Lumbar lateral flexion (25):     Thoracic and Lumbar rotation (30 degrees):       Hip Flexion (0-125):      Hip IR (0-45):     Hip ER (0-45):     Hip Abduction (0-40):     Hip extension (0-15):       STRENGTH: MMT deferred 2/2 to time constraints  RLE LLE  Hip Flexion    Hip Extension    Hip Abduction     Hip Adduction     Hip ER     Hip IR     Knee Extension    Knee Flexion    Dorsiflexion     Plantarflexion (seated)      ABDOMINAL: deferred 2/2 to time constraints Palpation: Diastasis: Scar mobility: Rib flare:  SPECIAL TESTS: deferred 2/2 to time constraints   PHYSICAL PERFORMANCE MEASURES: STS: WNL  EXTERNAL PELVIC EXAM: deferred 2/2 to time constraints Palpation: Breath coordination: Voluntary Contraction: present/absent Relaxation: full/delayed/non-relaxing Perineal movement with sustained IAP increase ("bear down"): descent/no change/elevation/excessive descent Perineal movement with rapid IAP increase ("cough"): elevation/no change/descent  INTERNAL VAGINAL EXAM: deferred 2/2 to time constraints Introitus Appears:  Skin integrity:  Scar mobility: Strength (PERF):  Symmetry: Palpation: Prolapse:   INTERNAL RECTAL EXAM: not indicated Strength (PERF): Symmetry: Palpation: Prolapse:   OUTCOME MEASURES: FOTO (69)   ASSESSMENT Patient is a 30 year old presenting to clinic with chief complaints of pelvic pain and urinary symptoms. Today's evaluation is suggestive of deficits in PFM coordination, PFM extensibility, posture, pain as evidenced by 8/10 pain with urination, burning and tension quality of pain, standing anterior pelvic tilt, seated increased hip adduction, difficulty  initiating urination, intermittent stream, SUI with sneezing, and pain with penetration. Patient's responses on FOTO Urinary outcome measures (69) indicate moderate functional limitations/disability/distress. Patient's progress may be limited due to persistence of symptoms; however, patient's motivation and health behaviors are advantageous. Patient educated today on bladder irritants and strategies to manage bladder irritation in the presence of known irritants. Patient will benefit from continued skilled therapeutic intervention to address deficits in PFM coordination, PFM extensibility, posture, pain in order to increase function and improve overall QOL.  EDUCATION Patient educated on prognosis, POC, and  provided with HEP including: bladder diary. Patient articulated understanding and returned demonstration. Patient will benefit from further education in order to maximize compliance and understanding for long-term therapeutic gains.  TREATMENT Neuromuscular Re-education: Patient educated on primary functions of the pelvic floor including: posture/balance, sexual pleasure, storage and elimination of waste from the body, abdominal cavity closure, and breath coordination.     Objective measurements completed on examination: See above findings.                     PT Long Term Goals - 07/04/21 1500       PT LONG TERM GOAL #1   Title Patient will demonstrate independence with HEP in order to maximize therapeutic gains and improve carryover from physical therapy sessions to ADLs in the home and community.    Baseline IE: not initiated    Time 12    Period Weeks    Status New    Target Date 09/26/21      PT LONG TERM GOAL #2   Title Patient will demonstrate independent and coordinated diaphragmatic breathing in supine with a 1:2 breathing pattern for improved down-regulation of the nervous system and improved management of intra-abdominal pressures in order to increase function at home and in the community.    Baseline IE: not demonstrated    Time 12    Period Weeks    Status New    Target Date 09/26/21      PT LONG TERM GOAL #3   Title Patient will demonstrate improved function as evidenced by a score of 77 on FOTO measure for full participation in activities at home and in the community.    Baseline IE: 69    Time 12    Period Weeks    Status New    Target Date 09/26/21      PT LONG TERM GOAL #4   Title Patient will decrease worst pain as reported on NPRS by at least 2 points to demonstrate clinically significant reduction in pain in order to restore/improve function and overall QOL.    Baseline IE: 8/10 (urination)    Time 12    Period Weeks    Status New     Target Date 09/26/21      PT LONG TERM GOAL #5   Title Patient will demonstrate understanding of basic self-management/down-regulation of the nervous system for persistent pain condition and stress as evidenced by diaphragmatic breathing without cueing, body scan/progressive relaxation meditation, and improved sleep hygiene including in order to transition to independent management of patient's chief complaint: pelvic pain.    Baseline IE: not demonstrated    Time 12    Period Weeks    Status New    Target Date 09/26/21                    Plan - 07/04/21 1326     Clinical Impression Statement Patient is a 30  year old presenting to clinic with chief complaints of pelvic pain and urinary symptoms. Today's evaluation is suggestive of deficits in PFM coordination, PFM extensibility, posture, pain as evidenced by 8/10 pain with urination, burning and tension quality of pain, standing anterior pelvic tilt, seated increased hip adduction, difficulty initiating urination, intermittent stream, SUI with sneezing, and pain with penetration. Patient's responses on FOTO Urinary outcome measures (69) indicate moderate functional limitations/disability/distress. Patient's progress may be limited due to persistence of symptoms; however, patient's motivation and health behaviors are advantageous. Patient educated today on bladder irritants and strategies to manage bladder irritation in the presence of known irritants. Patient will benefit from continued skilled therapeutic intervention to address deficits in PFM coordination, PFM extensibility, posture, pain in order to increase function and improve overall QOL.    Personal Factors and Comorbidities Behavior Pattern;Time since onset of injury/illness/exacerbation;Past/Current Experience    Examination-Activity Limitations Continence;Bend;Lift;Squat;Other    Examination-Participation Restrictions Community Activity;Interpersonal Relationship     Stability/Clinical Decision Making Evolving/Moderate complexity    Clinical Decision Making Moderate    Rehab Potential Fair    PT Frequency 1x / week    PT Duration 12 weeks    PT Treatment/Interventions ADLs/Self Care Home Management;Biofeedback;Cryotherapy;Electrical Stimulation;Moist Heat;Therapeutic activities;Therapeutic exercise;Neuromuscular re-education;Patient/family education;Orthotic Fit/Training;Manual techniques;Dry needling;Taping;Spinal Manipulations;Joint Manipulations;Scar mobilization;Compression bandaging    PT Next Visit Plan physical assessment    PT Home Exercise Plan not initiated    Consulted and Agree with Plan of Care Patient             Patient will benefit from skilled therapeutic intervention in order to improve the following deficits and impairments:  Pain, Postural dysfunction, Improper body mechanics, Decreased coordination, Increased muscle spasms, Decreased activity tolerance  Visit Diagnosis: Pelvic pain  Other muscle spasm  Other lack of coordination     Problem List Patient Active Problem List   Diagnosis Date Noted   Recurrent UTI 08/03/2019   Acne vulgaris 08/03/2019   Irregular periods 08/03/2019    Myles Gip PT, DPT [phone]  07/04/2021, 3:02 PM   [facility] [address] [place], [place], [address] Phone: [phone]   Fax:  [phone]  Name: Susan Cooper MRN: [id number] Date of Birth: 1991-07-16

## 2021-07-12 ENCOUNTER — Encounter: Payer: Self-pay | Admitting: Physical Therapy

## 2021-07-12 ENCOUNTER — Other Ambulatory Visit: Payer: Self-pay

## 2021-07-12 ENCOUNTER — Ambulatory Visit: Attending: Urology | Admitting: Physical Therapy

## 2021-07-12 DIAGNOSIS — R102 Pelvic and perineal pain: Secondary | ICD-10-CM | POA: Insufficient documentation

## 2021-07-12 DIAGNOSIS — R278 Other lack of coordination: Secondary | ICD-10-CM | POA: Insufficient documentation

## 2021-07-12 DIAGNOSIS — M62838 Other muscle spasm: Secondary | ICD-10-CM | POA: Diagnosis present

## 2021-07-12 NOTE — Therapy (Signed)
[facility] [address] [place], [place], [address] Phone: [phone]   Fax:  [phone]  Physical Therapy Treatment  Patient Details  Name: Susan Cooper MRN: [id number] Date of Birth: 03-30-1991 Referring Provider (PT): Diamantina Providence   Encounter Date: 07/12/2021   PT End of Session - 07/12/21 1721     Visit Number 2    Number of Visits 12    Date for PT Re-Evaluation 09/26/21    Authorization Type IE 07/04/2021    PT Start Time 1710    PT Stop Time 1755    PT Time Calculation (min) 45 min    Activity Tolerance Patient tolerated treatment well    Behavior During Therapy [facility] for tasks assessed/performed             Past Medical History:  Diagnosis Date   Chronic kidney disease    Kidney stone    Urinary tract infection     Past Surgical History:  Procedure Laterality Date   WISDOM TOOTH EXTRACTION      There were no vitals filed for this visit.   Subjective Assessment - 07/12/21 1711     Subjective Patient presents to clinic with bladder diary which is largely unremarkable. Patient notes that she did go to [place] over the weekend and participated in a brewery tour and buffered beers with water which she noted made a significant difference in her pain. Patient also participated in sexual activity for three consecutive nights and had no pain during activity. Patient notes that she did have one instance of SUI with sneeze.    Currently in Pain? No/denies             TREATMENT  Pre-treatment assessment: R IC elevated and posteriorly rotated RANGE OF MOTION:    LEFT RIGHT  Lumbar forward flexion (65):  WNL    Lumbar extension (30): WNL*    Lumbar lateral flexion (25):  WNL WNL  Thoracic and Lumbar rotation (30 degrees):    WNL WNL  Hip Flexion (0-125):   WNL WNL  Hip IR (0-45):  WNL 75% of R, WFL  Hip ER (0-45):  WNL WNL  Hip Abduction (0-40):  WNL WNL  Hip extension (0-15):  WNL WNL    ABDOMINAL:   Palpation: no TTP Diastasis: none present Scar mobility: n/a Rib flare: minimal noted B, however L>R  SPECIAL TESTS: FABER (SN 81): R: Positive for labral click L: Positive for labral click FADIR (SN 94): R: Negative L: Negative  EXTERNAL PELVIC EXAM: Patient educated on the purpose of the pelvic exam and articulated understanding; patient consented to the exam verbally. Breath coordination: absent, able to coordinate  Cued Lengthen: rectus dominance, perineal descent delayed and minimal Cued Contraction: hip adductor compensations, able to coordinate perineal ascent  Cough: no perineal movement  Neuromuscular Re-education: Donning and doffing of L heel lift for pelvic postural symmetry Supine hooklying diaphragmatic breathing with VCs and TCs for downregulation of the nervous system and improved management of IAP Supine hooklying, PFM lengthening with inhalation. VCs and TCs to decrease compensatory patterns and encourage optimal relaxation of the PFM.   Patient educated throughout session on appropriate technique and form using multi-modal cueing, HEP, and activity modification. Patient articulated understanding and returned demonstration.  Patient Response to interventions: Comfortable to work on diaphragmatic breathing and PFM lengthening with inhalation.  ASSESSMENT Patient presents to clinic with excellent motivation to participate in therapy. Patient demonstrates deficits in PFM coordination, PFM extensibility, posture,  pain. Patient able to achieve diaphragmatic breath with moderate verbal and tactile cueing during today's session and responded positively to educational and active interventions. Patient will benefit from continued skilled therapeutic intervention to address remaining deficits in PFM coordination, PFM extensibility, posture, pain in order to increase function and improve overall QOL.    PT Long Term Goals - 07/04/21 1500       PT LONG TERM GOAL #1   Title  Patient will demonstrate independence with HEP in order to maximize therapeutic gains and improve carryover from physical therapy sessions to ADLs in the home and community.    Baseline IE: not initiated    Time 12    Period Weeks    Status New    Target Date 09/26/21      PT LONG TERM GOAL #2   Title Patient will demonstrate independent and coordinated diaphragmatic breathing in supine with a 1:2 breathing pattern for improved down-regulation of the nervous system and improved management of intra-abdominal pressures in order to increase function at home and in the community.    Baseline IE: not demonstrated    Time 12    Period Weeks    Status New    Target Date 09/26/21      PT LONG TERM GOAL #3   Title Patient will demonstrate improved function as evidenced by a score of 77 on FOTO measure for full participation in activities at home and in the community.    Baseline IE: 69    Time 12    Period Weeks    Status New    Target Date 09/26/21      PT LONG TERM GOAL #4   Title Patient will decrease worst pain as reported on NPRS by at least 2 points to demonstrate clinically significant reduction in pain in order to restore/improve function and overall QOL.    Baseline IE: 8/10 (urination)    Time 12    Period Weeks    Status New    Target Date 09/26/21      PT LONG TERM GOAL #5   Title Patient will demonstrate understanding of basic self-management/down-regulation of the nervous system for persistent pain condition and stress as evidenced by diaphragmatic breathing without cueing, body scan/progressive relaxation meditation, and improved sleep hygiene including in order to transition to independent management of patient's chief complaint: pelvic pain.    Baseline IE: not demonstrated    Time 12    Period Weeks    Status New    Target Date 09/26/21                   Plan - 07/12/21 1721     Clinical Impression Statement Patient presents to clinic with excellent  motivation to participate in therapy. Patient demonstrates deficits in PFM coordination, PFM extensibility, posture, pain. Patient able to achieve diaphragmatic breath with moderate verbal and tactile cueing during today's session and responded positively to educational and active interventions. Patient will benefit from continued skilled therapeutic intervention to address remaining deficits in PFM coordination, PFM extensibility, posture, pain in order to increase function and improve overall QOL.    Personal Factors and Comorbidities Behavior Pattern;Time since onset of injury/illness/exacerbation;Past/Current Experience    Examination-Activity Limitations Continence;Bend;Lift;Squat;Other    Examination-Participation Restrictions Community Activity;Interpersonal Relationship    Stability/Clinical Decision Making Evolving/Moderate complexity    Rehab Potential Fair    PT Frequency 1x / week    PT Duration 12 weeks    PT Treatment/Interventions ADLs/Self Care  Home Management;Biofeedback;Cryotherapy;Electrical Stimulation;Moist Heat;Therapeutic activities;Therapeutic exercise;Neuromuscular re-education;Patient/family education;Orthotic Fit/Training;Manual techniques;Dry needling;Taping;Spinal Manipulations;Joint Manipulations;Scar mobilization;Compression bandaging    PT Next Visit Plan deep core, lumbar mobility, PFM progression    PT Home Exercise Plan diaphragmatic breathing, PFM lengthening mental imagery    Consulted and Agree with Plan of Care Patient             Patient will benefit from skilled therapeutic intervention in order to improve the following deficits and impairments:  Pain, Postural dysfunction, Improper body mechanics, Decreased coordination, Increased muscle spasms, Decreased activity tolerance  Visit Diagnosis: Pelvic pain  Other muscle spasm  Other lack of coordination     Problem List Patient Active Problem List   Diagnosis Date Noted   Recurrent UTI  08/03/2019   Acne vulgaris 08/03/2019   Irregular periods 08/03/2019    Myles Gip PT, DPT [phone]  07/13/2021, 10:44 AM  [facility] [address] [place], [place], [address] Phone: [phone]   Fax:  [phone]  Name: Susan Cooper MRN: [id number] Date of Birth: 1991/05/27

## 2021-07-19 ENCOUNTER — Encounter: Admitting: Physical Therapy

## 2021-07-26 DIAGNOSIS — R102 Pelvic and perineal pain: Secondary | ICD-10-CM | POA: Diagnosis not present

## 2021-07-26 MED ORDER — PROPOFOL 500 MG/50ML IV EMUL
INTRAVENOUS | Status: AC
  Filled 2021-07-26: qty 50

## 2021-07-26 MED ORDER — PROPOFOL 10 MG/ML IV BOLUS
INTRAVENOUS | Status: AC
  Filled 2021-07-26: qty 20

## 2021-07-26 MED ORDER — PHENYLEPHRINE HCL (PRESSORS) 10 MG/ML IV SOLN
INTRAVENOUS | Status: AC
  Filled 2021-07-26: qty 1

## 2021-07-27 ENCOUNTER — Encounter: Payer: Self-pay | Admitting: Physical Therapy

## 2021-07-27 ENCOUNTER — Other Ambulatory Visit: Payer: Self-pay

## 2021-07-27 ENCOUNTER — Ambulatory Visit: Admitting: Physical Therapy

## 2021-07-27 DIAGNOSIS — R102 Pelvic and perineal pain: Secondary | ICD-10-CM

## 2021-07-27 DIAGNOSIS — R278 Other lack of coordination: Secondary | ICD-10-CM

## 2021-07-27 DIAGNOSIS — M62838 Other muscle spasm: Secondary | ICD-10-CM

## 2021-07-27 NOTE — Therapy (Signed)
[facility] [address]. [place], [place], [address] Phone: [phone]   Fax:  [phone]  Physical Therapy Treatment  Patient Details  Name: Susan Cooper MRN: [id number] Date of Birth: 10-Feb-1991 Referring Provider (PT): Diamantina Providence   Encounter Date: 07/27/2021   PT End of Session - 07/27/21 1723     Visit Number 3    Number of Visits 12    Date for PT Re-Evaluation 09/26/21    Authorization Type IE 07/04/2021    PT Start Time 1720    PT Stop Time 1800    PT Time Calculation (min) 40 min    Activity Tolerance Patient tolerated treatment well    Behavior During Therapy [facility] for tasks assessed/performed             Past Medical History:  Diagnosis Date   Chronic kidney disease    Kidney stone    Urinary tract infection     Past Surgical History:  Procedure Laterality Date   WISDOM TOOTH EXTRACTION      There were no vitals filed for this visit.   Subjective Assessment - 07/27/21 1724     Subjective Patient denies any significant issues since last session. Patient does note that with her present menstruation she is having some increased abdominal cramping/discomfort. Patient has been wearing heel lift without issue/complaint. Patient has also continued to work on diaphragmatic breathing.    Currently in Pain? Yes    Pain Score 3     Pain Location Abdomen    Pain Orientation Lower    Pain Descriptors / Indicators Cramping             TREATMENT  Manual Therapy: Gentle lymphatic stimulation of the abdomen and inguinal region for improved pain management. Patient educated throughout on proper technique and provided with handout for home practice.  Neuromuscular Re-education: Lumbar mobility and postural awareness sequence: Supine Posterior Pelvic Tilt Cat to Child's Pose with Posterior Pelvic Tilt  Quadruped Pelvic Tilt Child's Pose Stretch    Patient educated throughout session on appropriate  technique and form using multi-modal cueing, HEP, and activity modification. Patient articulated understanding and returned demonstration.  Patient Response to interventions: Comfortable to work on  new HEP; 1/10 pain  ASSESSMENT Patient presents to clinic with excellent motivation to participate in therapy. Patient demonstrates deficits in PFM coordination, PFM extensibility, posture, pain. Patient had 2 point reduction in pain (NPRS) with manual interventions during today's session and responded positively to active interventions, though had difficulty controlling compensatory mechanisms during lumbar mobility interventions. Patient will benefit from continued skilled therapeutic intervention to address remaining deficits in PFM coordination, PFM extensibility, posture, pain in order to increase function and improve overall QOL.    PT Long Term Goals - 07/04/21 1500       PT LONG TERM GOAL #1   Title Patient will demonstrate independence with HEP in order to maximize therapeutic gains and improve carryover from physical therapy sessions to ADLs in the home and community.    Baseline IE: not initiated    Time 12    Period Weeks    Status New    Target Date 09/26/21      PT LONG TERM GOAL #2   Title Patient will demonstrate independent and coordinated diaphragmatic breathing in supine with a 1:2 breathing pattern for improved down-regulation of the nervous system and improved management of intra-abdominal pressures in order to increase function at home and in the community.  Baseline IE: not demonstrated    Time 12    Period Weeks    Status New    Target Date 09/26/21      PT LONG TERM GOAL #3   Title Patient will demonstrate improved function as evidenced by a score of 77 on FOTO measure for full participation in activities at home and in the community.    Baseline IE: 69    Time 12    Period Weeks    Status New    Target Date 09/26/21      PT LONG TERM GOAL #4   Title Patient  will decrease worst pain as reported on NPRS by at least 2 points to demonstrate clinically significant reduction in pain in order to restore/improve function and overall QOL.    Baseline IE: 8/10 (urination)    Time 12    Period Weeks    Status New    Target Date 09/26/21      PT LONG TERM GOAL #5   Title Patient will demonstrate understanding of basic self-management/down-regulation of the nervous system for persistent pain condition and stress as evidenced by diaphragmatic breathing without cueing, body scan/progressive relaxation meditation, and improved sleep hygiene including in order to transition to independent management of patient's chief complaint: pelvic pain.    Baseline IE: not demonstrated    Time 12    Period Weeks    Status New    Target Date 09/26/21                   Plan - 07/27/21 1729     Clinical Impression Statement Patient presents to clinic with excellent motivation to participate in therapy. Patient demonstrates deficits in PFM coordination, PFM extensibility, posture, pain. Patient had 2 point reduction in pain (NPRS) with manual interventions during today's session and responded positively to active interventions, though had difficulty controlling compensatory mechanisms during lumbar mobility interventions. Patient will benefit from continued skilled therapeutic intervention to address remaining deficits in PFM coordination, PFM extensibility, posture, pain in order to increase function and improve overall QOL.    Personal Factors and Comorbidities Behavior Pattern;Time since onset of injury/illness/exacerbation;Past/Current Experience    Examination-Activity Limitations Continence;Bend;Lift;Squat;Other    Examination-Participation Restrictions Community Activity;Interpersonal Relationship    Stability/Clinical Decision Making Evolving/Moderate complexity    Rehab Potential Fair    PT Frequency 1x / week    PT Duration 12 weeks    PT  Treatment/Interventions ADLs/Self Care Home Management;Biofeedback;Cryotherapy;Electrical Stimulation;Moist Heat;Therapeutic activities;Therapeutic exercise;Neuromuscular re-education;Patient/family education;Orthotic Fit/Training;Manual techniques;Dry needling;Taping;Spinal Manipulations;Joint Manipulations;Scar mobilization;Compression bandaging    PT Next Visit Plan deep core, lumbar mobility, PFM progression    PT Home Exercise Plan diaphragmatic breathing, PFM lengthening mental imagery    Consulted and Agree with Plan of Care Patient             Patient will benefit from skilled therapeutic intervention in order to improve the following deficits and impairments:  Pain, Postural dysfunction, Improper body mechanics, Decreased coordination, Increased muscle spasms, Decreased activity tolerance  Visit Diagnosis: Pelvic pain  Other muscle spasm  Other lack of coordination     Problem List Patient Active Problem List   Diagnosis Date Noted   Recurrent UTI 08/03/2019   Acne vulgaris 08/03/2019   Irregular periods 08/03/2019    Myles Gip PT, DPT [phone]  07/27/2021, 6:13 PM  [facility] [address] [place], [place], [address] Phone: [phone]   Fax:  [phone]  Name: Myrtie Soman  Beissel MRN: [id number] Date of Birth: [birth year]

## 2021-08-01 ENCOUNTER — Ambulatory Visit: Admitting: Physical Therapy

## 2021-08-01 ENCOUNTER — Other Ambulatory Visit: Payer: Self-pay

## 2021-08-01 ENCOUNTER — Encounter: Payer: Self-pay | Admitting: Physical Therapy

## 2021-08-01 DIAGNOSIS — M62838 Other muscle spasm: Secondary | ICD-10-CM

## 2021-08-01 DIAGNOSIS — R102 Pelvic and perineal pain: Secondary | ICD-10-CM | POA: Diagnosis not present

## 2021-08-01 DIAGNOSIS — R278 Other lack of coordination: Secondary | ICD-10-CM

## 2021-08-02 NOTE — Therapy (Signed)
[facility] [address] [place], [place], [address] Phone: [phone]   Fax:  [phone]  Physical Therapy Treatment  Patient Details  Name: Susan Cooper MRN: [id number] Date of Birth: 1990-12-31 Referring Provider (PT): Diamantina Providence   Encounter Date: 08/01/2021   PT End of Session - 08/01/21 1719     Visit Number 4    Number of Visits 12    Date for PT Re-Evaluation 09/26/21    Authorization Type IE 07/04/2021    PT Start Time 1712    PT Stop Time 1750    PT Time Calculation (min) 38 min    Activity Tolerance Patient tolerated treatment well    Behavior During Therapy [facility] for tasks assessed/performed             Past Medical History:  Diagnosis Date   Chronic kidney disease    Kidney stone    Urinary tract infection     Past Surgical History:  Procedure Laterality Date   WISDOM TOOTH EXTRACTION      There were no vitals filed for this visit.   Subjective Assessment - 08/01/21 1713     Subjective Patient notes that she continues to be about the same. Does report one day of suspected UTI (frequent urination and burning with urination), but she took one dose of Keflex and increased water intake and notes it was managed. She does note that the symptoms were correlated to post-sexual activity. Otherwise pain has been well managed. Patient did have abdominal pain relief with gentle lymphatic stimulation and has been working on lumbar spinal mobility.    Currently in Pain? No/denies            TREATMENT  Neuromuscular Re-education: Lumbar mobility and postural awareness sequence: Slouch Overcorrect on Swiss Ball  Pelvic Tilt on Swiss Chief [employer] with Swiss Ball  Educated on Supine Thoracic Extension on [employer] Thoracic Extension Mobilization over small ball with maintained pelvic tilt  Patient educated throughout session on appropriate technique and form using multi-modal cueing,  HEP, and activity modification. Patient articulated understanding and returned demonstration.  Patient Response to interventions: Comfortable to work on new HEP  ASSESSMENT Patient presents to clinic with excellent motivation to participate in therapy. Patient demonstrates deficits in PFM coordination, PFM extensibility, posture, pain. Patient with increased difficulty isolating lumbar spine with seated pelvic tilts, but able to coordinate thoracic extension in sitting during today's session and responded positively to active interventions. Patient will benefit from continued skilled therapeutic intervention to address remaining deficits in PFM coordination, PFM extensibility, posture, pain in order to increase function and improve overall QOL.      PT Long Term Goals - 07/04/21 1500       PT LONG TERM GOAL #1   Title Patient will demonstrate independence with HEP in order to maximize therapeutic gains and improve carryover from physical therapy sessions to ADLs in the home and community.    Baseline IE: not initiated    Time 12    Period Weeks    Status New    Target Date 09/26/21      PT LONG TERM GOAL #2   Title Patient will demonstrate independent and coordinated diaphragmatic breathing in supine with a 1:2 breathing pattern for improved down-regulation of the nervous system and improved management of intra-abdominal pressures in order to increase function at home and in the community.    Baseline IE: not demonstrated  Time 12    Period Weeks    Status New    Target Date 09/26/21      PT LONG TERM GOAL #3   Title Patient will demonstrate improved function as evidenced by a score of 77 on FOTO measure for full participation in activities at home and in the community.    Baseline IE: 69    Time 12    Period Weeks    Status New    Target Date 09/26/21      PT LONG TERM GOAL #4   Title Patient will decrease worst pain as reported on NPRS by at least 2 points to demonstrate  clinically significant reduction in pain in order to restore/improve function and overall QOL.    Baseline IE: 8/10 (urination)    Time 12    Period Weeks    Status New    Target Date 09/26/21      PT LONG TERM GOAL #5   Title Patient will demonstrate understanding of basic self-management/down-regulation of the nervous system for persistent pain condition and stress as evidenced by diaphragmatic breathing without cueing, body scan/progressive relaxation meditation, and improved sleep hygiene including in order to transition to independent management of patient's chief complaint: pelvic pain.    Baseline IE: not demonstrated    Time 12    Period Weeks    Status New    Target Date 09/26/21                   Plan - 08/01/21 1719     Clinical Impression Statement Patient presents to clinic with excellent motivation to participate in therapy. Patient demonstrates deficits in PFM coordination, PFM extensibility, posture, pain. Patient with increased difficulty isolating lumbar spine with seated pelvic tilts, but able to coordinate thoracic extension in sitting during today's session and responded positively to active interventions. Patient will benefit from continued skilled therapeutic intervention to address remaining deficits in PFM coordination, PFM extensibility, posture, pain in order to increase function and improve overall QOL.    Personal Factors and Comorbidities Behavior Pattern;Time since onset of injury/illness/exacerbation;Past/Current Experience    Examination-Activity Limitations Continence;Bend;Lift;Squat;Other    Examination-Participation Restrictions Community Activity;Interpersonal Relationship    Stability/Clinical Decision Making Evolving/Moderate complexity    Rehab Potential Fair    PT Frequency 1x / week    PT Duration 12 weeks    PT Treatment/Interventions ADLs/Self Care Home Management;Biofeedback;Cryotherapy;Electrical Stimulation;Moist Heat;Therapeutic  activities;Therapeutic exercise;Neuromuscular re-education;Patient/family education;Orthotic Fit/Training;Manual techniques;Dry needling;Taping;Spinal Manipulations;Joint Manipulations;Scar mobilization;Compression bandaging    PT Next Visit Plan deep core, lumbar mobility, PFM progression    PT Home Exercise Plan diaphragmatic breathing, PFM lengthening mental imagery    Consulted and Agree with Plan of Care Patient             Patient will benefit from skilled therapeutic intervention in order to improve the following deficits and impairments:  Pain, Postural dysfunction, Improper body mechanics, Decreased coordination, Increased muscle spasms, Decreased activity tolerance  Visit Diagnosis: Pelvic pain  Other muscle spasm  Other lack of coordination     Problem List Patient Active Problem List   Diagnosis Date Noted   Recurrent UTI 08/03/2019   Acne vulgaris 08/03/2019   Irregular periods 08/03/2019    Myles Gip PT, DPT [phone]  08/02/2021, 12:04 PM  [facility] [address] [place], [place], [address] Phone: [phone]   Fax:  [phone]  Name: Susan Cooper MRN: [id number] Date of Birth: 21-Aug-1991

## 2021-08-08 ENCOUNTER — Encounter: Payer: Self-pay | Admitting: Certified Nurse Midwife

## 2021-08-08 ENCOUNTER — Ambulatory Visit (INDEPENDENT_AMBULATORY_CARE_PROVIDER_SITE_OTHER): Admitting: Certified Nurse Midwife

## 2021-08-08 ENCOUNTER — Other Ambulatory Visit (HOSPITAL_COMMUNITY)
Admission: RE | Admit: 2021-08-08 | Discharge: 2021-08-08 | Disposition: A | Discharge status: Home / Self Care | Source: Ambulatory Visit | Attending: Certified Nurse Midwife | Admitting: Certified Nurse Midwife

## 2021-08-08 ENCOUNTER — Other Ambulatory Visit: Payer: Self-pay

## 2021-08-08 VITALS — BP 107/70 | HR 53 | Ht 67.0 in | Wt 140.9 lb

## 2021-08-08 DIAGNOSIS — Z124 Encounter for screening for malignant neoplasm of cervix: Secondary | ICD-10-CM | POA: Insufficient documentation

## 2021-08-08 DIAGNOSIS — Z01419 Encounter for gynecological examination (general) (routine) without abnormal findings: Secondary | ICD-10-CM | POA: Insufficient documentation

## 2021-08-08 DIAGNOSIS — Z8349 Family history of other endocrine, nutritional and metabolic diseases: Secondary | ICD-10-CM

## 2021-08-08 DIAGNOSIS — R5383 Other fatigue: Secondary | ICD-10-CM | POA: Diagnosis not present

## 2021-08-08 MED ORDER — CEPHALEXIN 250 MG PO CAPS
250.0000 mg | ORAL_CAPSULE | Freq: Once | ORAL | 3 refills | Status: DC | PRN
Start: 2021-08-08 — End: 2021-11-16

## 2021-08-08 NOTE — Patient Instructions (Signed)
Preventive Care 21-31 Years Old, Female Preventive care refers to lifestyle choices and visits with your health care provider that can promote health and wellness. This includes: A yearly physical exam. This is also called an annual wellness visit. Regular dental and eye exams. Immunizations. Screening for certain conditions. Healthy lifestyle choices, such as: Eating a healthy diet. Getting regular exercise. Not using drugs or products that contain nicotine and tobacco. Limiting alcohol use. What can I expect for my preventive care visit? Physical exam Your health care provider may check your: Height and weight. These may be used to calculate your BMI (body mass index). BMI is a measurement that tells if you are at a healthy weight. Heart rate and blood pressure. Body temperature. Skin for abnormal spots. Counseling Your health care provider may ask you questions about your: Past medical problems. Family's medical history. Alcohol, tobacco, and drug use. Emotional well-being. Home life and relationship well-being. Sexual activity. Diet, exercise, and sleep habits. Work and work environment. Access to firearms. Method of birth control. Menstrual cycle. Pregnancy history. What immunizations do I need? Vaccines are usually given at various ages, according to a schedule. Your health care provider will recommend vaccines for you based on your age, medical history, and lifestyle or other factors, such as travel or where you work. What tests do I need? Blood tests Lipid and cholesterol levels. These may be checked every 5 years starting at age 20. Hepatitis C test. Hepatitis B test. Screening Diabetes screening. This is done by checking your blood sugar (glucose) after you have not eaten for a while (fasting). STD (sexually transmitted disease) testing, if you are at risk. BRCA-related cancer screening. This may be done if you have a family history of breast, ovarian, tubal, or  peritoneal cancers. Pelvic exam and Pap test. This may be done every 3 years starting at age 21. Starting at age 30, this may be done every 5 years if you have a Pap test in combination with an HPV test. Talk with your health care provider about your test results, treatment options, and if necessary, the need for more tests. Follow these instructions at home: Eating and drinking  Eat a healthy diet that includes fresh fruits and vegetables, whole grains, lean protein, and low-fat dairy products. Take vitamin and mineral supplements as recommended by your health care provider. Do not drink alcohol if: Your health care provider tells you not to drink. You are pregnant, may be pregnant, or are planning to become pregnant. If you drink alcohol: Limit how much you have to 0-1 drink a day. Be aware of how much alcohol is in your drink. In the [place] one drink equals one 12 oz bottle of beer (355 mL), one 5 oz glass of wine (148 mL), or one 1 oz glass of hard liquor (44 mL). Lifestyle Take daily care of your teeth and gums. Brush your teeth every morning and night with fluoride toothpaste. Floss one time each day. Stay active. Exercise for at least 30 minutes 5 or more days each week. Do not use any products that contain nicotine or tobacco, such as cigarettes, e-cigarettes, and chewing tobacco. If you need help quitting, ask your health care provider. Do not use drugs. If you are sexually active, practice safe sex. Use a condom or other form of protection to prevent STIs (sexually transmitted infections). If you do not wish to become pregnant, use a form of birth control. If you plan to become pregnant, see your health care provider   for a prepregnancy visit. Find healthy ways to cope with stress, such as: Meditation, yoga, or listening to music. Journaling. Talking to a trusted person. Spending time with friends and family. Safety Always wear your seat belt while driving or riding in a  vehicle. Do not drive: If you have been drinking alcohol. Do not ride with someone who has been drinking. When you are tired or distracted. While texting. Wear a helmet and other protective equipment during sports activities. If you have firearms in your house, make sure you follow all gun safety procedures. Seek help if you have been physically or sexually abused. What's next? Go to your health care provider once a year for an annual wellness visit. Ask your health care provider how often you should have your eyes and teeth checked. Stay up to date on all vaccines. This information is not intended to replace advice given to you by your health care provider. Make sure you discuss any questions you have with your health care provider. Document Revised: 12/02/2020 Document Reviewed: 06/05/2018 Elsevier Patient Education  2022 Elsevier Inc.  

## 2021-08-08 NOTE — Progress Notes (Signed)
GYNECOLOGY ANNUAL PREVENTATIVE CARE ENCOUNTER NOTE  History:     Susan Cooper is a 30 y.o. G0P0000 female here for a routine annual gynecologic exam.  Current complaints: fatigue, pt has family hx thyroid d/o.   Denies abnormal vaginal bleeding, discharge, pelvic pain, problems with intercourse or other gynecologic concerns.     Social Relationship:divorced /new relationship 2021 Living:  partner Work: Environmental health practitioner  Exercise: [employer], cardio & [employer] Smoke/Alcohol/drug use: rare alcohol use   Gynecologic History Patient's last menstrual period was 07/24/2021 (exact date). Contraception:  natural family planning Last Pap: 08/03/2020. Results were: normal with positive HPV Last mammogram: n/a.   Obstetric History OB History  Gravida Para Term Preterm AB Living  0 0 0 0 0 0  SAB IAB Ectopic Multiple Live Births  0 0 0 0 0    Past Medical History:  Diagnosis Date   Chronic kidney disease    Kidney stone    Urinary tract infection     Past Surgical History:  Procedure Laterality Date   WISDOM TOOTH EXTRACTION      Current Outpatient Medications on File Prior to Visit  Medication Sig Dispense Refill   amitriptyline (ELAVIL) 25 MG tablet Take 1 tablet (25 mg total) by mouth at bedtime. 30 tablet 3   No current facility-administered medications on file prior to visit.    No Known Allergies  Social History:  reports that she has never smoked. She has never used smokeless tobacco. She reports current alcohol use. She reports that she does not use drugs.  Family History  Problem Relation Age of Onset   Thyroid disease Mother    Hypertension Mother    Hypertension Father    Melanoma Paternal Aunt    Thyroid disease Maternal Grandmother    Hematuria Paternal Grandmother    Kidney failure Paternal Grandmother    Kidney disease Paternal Grandfather    Kidney cancer Neg Hx    Breast cancer Neg Hx    Ovarian cancer Neg Hx    Colon cancer Neg Hx     The following  portions of the patient's history were reviewed and updated as appropriate: allergies, current medications, past family history, past medical history, past social history, past surgical history and problem list.  Review of Systems Pertinent items noted in HPI and remainder of comprehensive ROS otherwise negative.  Physical Exam:  BP 107/70   Pulse (!) 53   Ht 5\' 7"  (1.702 m)   Wt 140 lb 14.4 oz (63.9 kg)   LMP 07/24/2021 (Exact Date)   BMI 22.07 kg/m  CONSTITUTIONAL: Well-developed, well-nourished female in no acute distress.  HENT:  Normocephalic, atraumatic, External right and left ear normal. Oropharynx is clear and moist EYES: Conjunctivae and EOM are normal. Pupils are equal, round, and reactive to light. No scleral icterus.  NECK: Normal range of motion, supple, no masses.  Normal thyroid.  SKIN: Skin is warm and dry. No rash noted. Not diaphoretic. No erythema. No pallor. MUSCULOSKELETAL: Normal range of motion. No tenderness.  No cyanosis, clubbing, or edema.  2+ distal pulses. NEUROLOGIC: Alert and oriented to person, place, and time. Normal reflexes, muscle tone coordination.  PSYCHIATRIC: Normal mood and affect. Normal behavior. Normal judgment and thought content. CARDIOVASCULAR: Normal heart rate noted, regular rhythm RESPIRATORY: Clear to auscultation bilaterally. Effort and breath sounds normal, no problems with respiration noted. BREASTS: Symmetric in size. No masses, tenderness, skin changes, nipple drainage, or lymphadenopathy bilaterally.  ABDOMEN: Soft, no distention noted.  No tenderness, rebound or guarding.  PELVIC: Normal appearing external genitalia and urethral meatus; normal appearing vaginal mucosa and cervix.  No abnormal discharge noted.  Pap smear obtained.  Normal uterine size, no other palpable masses, no uterine or adnexal tenderness.  .   Assessment and Plan:    1. Well woman exam with routine gynecological exam    Pap: Will follow up results of pap  smear and manage accordingly. Mammogram : n/a Labs:tsh Refills: keflex 250 mg PO postcoital ( UTI prophylaxis)  Referral: none Routine preventative health maintenance measures emphasized. Please refer to After Visit Summary for other counseling recommendations.      Philip Aspen, CNM [facility]

## 2021-08-09 ENCOUNTER — Encounter: Payer: Self-pay | Admitting: Physical Therapy

## 2021-08-09 LAB — TSH: TSH: 1.24 u[IU]/mL (ref 0.450–4.500)

## 2021-08-10 ENCOUNTER — Encounter: Payer: Self-pay | Admitting: Physical Therapy

## 2021-08-14 LAB — CYTOLOGY - PAP
Diagnosis: NEGATIVE
Diagnosis: REACTIVE

## 2021-08-16 ENCOUNTER — Telehealth: Payer: Self-pay | Admitting: Certified Nurse Midwife

## 2021-08-16 ENCOUNTER — Ambulatory Visit: Admitting: Physical Therapy

## 2021-08-16 NOTE — Telephone Encounter (Signed)
Pt aware AT is reviewing results and will reach out with information.

## 2021-08-16 NOTE — Telephone Encounter (Signed)
Pt is calling in stating that she received her PAP results on her mychart and would like to see if someone could go over it with her.  Pt would like to have a call back.

## 2021-08-21 ENCOUNTER — Encounter: Payer: Self-pay | Admitting: Family Medicine

## 2021-08-21 ENCOUNTER — Other Ambulatory Visit: Payer: Self-pay | Admitting: Family Medicine

## 2021-08-21 ENCOUNTER — Ambulatory Visit (INDEPENDENT_AMBULATORY_CARE_PROVIDER_SITE_OTHER): Admitting: Family Medicine

## 2021-08-21 ENCOUNTER — Other Ambulatory Visit: Payer: Self-pay

## 2021-08-21 VITALS — BP 112/68 | HR 73 | Temp 98.8°F | Resp 18 | Ht 67.0 in | Wt 137.0 lb

## 2021-08-21 DIAGNOSIS — N644 Mastodynia: Secondary | ICD-10-CM

## 2021-08-21 DIAGNOSIS — R87619 Unspecified abnormal cytological findings in specimens from cervix uteri: Secondary | ICD-10-CM

## 2021-08-21 DIAGNOSIS — F902 Attention-deficit hyperactivity disorder, combined type: Secondary | ICD-10-CM | POA: Diagnosis not present

## 2021-08-21 MED ORDER — AMPHETAMINE-DEXTROAMPHET ER 15 MG PO CP24: 15.0000 mg | ORAL_CAPSULE | ORAL | 0 refills | Status: DC

## 2021-08-21 NOTE — Progress Notes (Signed)
Subjective:    Patient ID: Susan Cooper, female    DOB: Sep 17, 1991, 30 y.o.   MRN: [id number]  HPI Patient is a very pleasant 30 year old Caucasian female who presents today for several concerns.  First she think she has ADD.  She states that she is extremely easily distracted.  She finds it difficult to maintain focus and a hectic and busy environment at work.  She has a difficult time finishing projects that require sustained mental effort.  She easily finds her self distracted and starting other projects having not completed her original assignment.  She states that she has had issues with this ever since she was a child.  She did well in school and made A's and B's although she was often in trouble for talking in class and disrupting class.  She has a difficult time waiting her turn and often will interrupt others while speaking almost trying to finish their sentences.  She denies any issues with depression or anxiety or mania.  On her ADHD adult self screen, the patient often reports trouble wrapping up final details of a project.  She often reports trouble having a difficult time getting things in order and having trouble with organization.  She avoids tasks that requires a lot of thought.  She states that she very often has to fidget or squirm when she sits for a long period of time.  She very often feels overly active and compelled to do things like she was driven by motor.  She states that she often makes careless mistakes when working on a boring or difficult project.  She often has trouble keeping her attention when she is doing repetitive work.  She often has trouble concentrating on what people say.  She often misplaces things at home and at work.  She is often easily distracted by noise around her.  She often interrupts others when they are busy and she often finds her self finishing the sentences of people with she is talking to.  This is not causing her trouble at work or trouble in her  relationships however she feels that if she was better able to maintain focus she could do better in her assignments.  She also reports painful area in her right breast.  The area is located approximately 1:00 from the nipple about 2 inches away.  Today on examination with a chaperone present, I do not palpate any abnormality.  She states that it was very tender and firm 2 days ago during the start of her menstrual cycle however it has improved rapidly and is almost back to normal today.  I do not appreciate any abnormality today on exam.  Lastly, she is requesting referral to gynecology.  She had an abnormal Pap smear and her gynecologist recommended a colposcopy.  However due to her insurance she needs a referral from her primary care physician.  Past Medical History:  Diagnosis Date   Chronic kidney disease    Kidney stone    Urinary tract infection    Past Surgical History:  Procedure Laterality Date   WISDOM TOOTH EXTRACTION     Current Outpatient Medications on File Prior to Visit  Medication Sig Dispense Refill   amitriptyline (ELAVIL) 25 MG tablet Take 1 tablet (25 mg total) by mouth at bedtime. 30 tablet 3   cephALEXin (KEFLEX) 250 MG capsule Take 1 capsule (250 mg total) by mouth once as needed for up to 1 dose. One table after intercourse 90 capsule 3  spironolactone (ALDACTONE) 25 MG tablet Take 25 mg by mouth 2 (two) times daily.     tretinoin (RETIN-A) 0.05 % cream      No current facility-administered medications on file prior to visit.   No Known Allergies Social History   Socioeconomic History   Marital status: Divorced    Spouse name: Not on file   Number of children: Not on file   Years of education: Not on file   Highest education level: Not on file  Occupational History   Not on file  Tobacco Use   Smoking status: Never   Smokeless tobacco: Never  Vaping Use   Vaping Use: Never used  Substance and Sexual Activity   Alcohol use: Yes    Comment: occasional     Drug use: No   Sexual activity: Yes    Birth control/protection: Other-see comments    Comment: 'pull out method'  Other Topics Concern   Not on file  Social History Narrative   Not on file   Social Determinants of Health   Financial Resource Strain: Not on file  Food Insecurity: Not on file  Transportation Needs: Not on file  Physical Activity: Not on file  Stress: Not on file  Social Connections: Not on file  Intimate Partner Violence: Not on file   Family History  Problem Relation Age of Onset   Thyroid disease Mother    Hypertension Mother    Hypertension Father    Melanoma Paternal Aunt    Thyroid disease Maternal Grandmother    Hematuria Paternal Grandmother    Kidney failure Paternal Grandmother    Kidney disease Paternal Grandfather    Kidney cancer Neg Hx    Breast cancer Neg Hx    Ovarian cancer Neg Hx    Colon cancer Neg Hx      Review of Systems  All other systems reviewed and are negative.     Objective:   Physical Exam Vitals reviewed.  Constitutional:      General: She is not in acute distress.    Appearance: Normal appearance. She is normal weight. She is not ill-appearing, toxic-appearing or diaphoretic.  Eyes:     General: No scleral icterus.       Right eye: No discharge.        Left eye: No discharge.     Extraocular Movements: Extraocular movements intact.     Conjunctiva/sclera: Conjunctivae normal.     Pupils: Pupils are equal, round, and reactive to light.  Cardiovascular:     Rate and Rhythm: Normal rate and regular rhythm.     Heart sounds: Normal heart sounds. No murmur heard.   No friction rub. No gallop.  Pulmonary:     Effort: Pulmonary effort is normal. No respiratory distress.     Breath sounds: Normal breath sounds. No stridor. No wheezing, rhonchi or rales.  Chest:     Chest wall: No mass, deformity, swelling or tenderness.    Abdominal:     Tenderness: There is no abdominal tenderness. There is no guarding or  rebound.     Hernia: No hernia is present.  Musculoskeletal:     Right lower leg: No edema.     Left lower leg: No edema.  Skin:    Coloration: Skin is not jaundiced or pale.     Findings: No bruising, erythema, lesion or rash.  Neurological:     General: No focal deficit present.     Mental Status: She is alert and oriented  to person, place, and time. Mental status is at baseline.     Cranial Nerves: No cranial nerve deficit.     Sensory: No sensory deficit.     Motor: No weakness.     Coordination: Coordination normal.     Gait: Gait normal.     Deep Tendon Reflexes: Reflexes normal.  Psychiatric:        Mood and Affect: Mood normal.        Behavior: Behavior normal.        Thought Content: Thought content normal.        Judgment: Judgment normal.          Assessment & Plan:  Abnormal cervical Papanicolaou smear, unspecified abnormal pap finding - Plan: Ambulatory referral to Gynecology  Attention deficit hyperactivity disorder (ADHD), combined type  Breast pain We will try the patient on Adderall extended release 15 mg daily for ADD and see how she does.  Reassess in 1 month.  I will refer the patient to gynecology due to her abnormal Pap smear for possible colposcopy.  Regarding her breast pain, I do not appreciate any abnormalities today on her exam.  I reassured the patient that her exam is normal and we will monitor the area clinically.

## 2021-08-23 ENCOUNTER — Other Ambulatory Visit: Payer: Self-pay

## 2021-08-23 ENCOUNTER — Encounter: Payer: Self-pay | Admitting: Physical Therapy

## 2021-08-23 ENCOUNTER — Ambulatory Visit: Attending: Urology | Admitting: Physical Therapy

## 2021-08-23 DIAGNOSIS — M62838 Other muscle spasm: Secondary | ICD-10-CM

## 2021-08-23 DIAGNOSIS — R278 Other lack of coordination: Secondary | ICD-10-CM | POA: Diagnosis present

## 2021-08-23 DIAGNOSIS — R102 Pelvic and perineal pain unspecified side: Secondary | ICD-10-CM

## 2021-08-23 NOTE — Therapy (Signed)
[facility] [address] [place], [place], [address] Phone: [phone]   Fax:  [phone]  Physical Therapy Treatment  Patient Details  Name: Susan Cooper MRN: [id number] Date of Birth: 1991-05-03 Referring Provider (PT): Diamantina Providence   Encounter Date: 08/23/2021   PT End of Session - 08/23/21 1728     Visit Number 5    Number of Visits 12    Date for PT Re-Evaluation 09/26/21    Authorization Type IE 07/04/2021    PT Start Time 2725    PT Stop Time 1755    PT Time Calculation (min) 40 min    Activity Tolerance Patient tolerated treatment well    Behavior During Therapy [facility] for tasks assessed/performed             Past Medical History:  Diagnosis Date   Chronic kidney disease    Kidney stone    Urinary tract infection     Past Surgical History:  Procedure Laterality Date   WISDOM TOOTH EXTRACTION      There were no vitals filed for this visit.   Subjective Assessment - 08/23/21 1724     Subjective Patient has been dealing with URI for the past two weeks and has not been able to do exercises due to feeling unwell. Patient is awaiting colposcopy. Patient is presently menstruating and notes cycle came early and has been essentially split.    Currently in Pain? Yes    Pain Score 5     Pain Location Suprapubic            TREATMENT  Neuromuscular Re-education: Patient educated extensively on female sexual response (circular model) and biopsychosocial factors impacting sexual function. Provided with handouts to reinforce information.  Patient educated throughout session on appropriate technique and form using multi-modal cueing, HEP, and activity modification. Patient articulated understanding and returned demonstration.  Patient Response to interventions: Comfortable to return in 1 week  ASSESSMENT Patient presents to clinic with excellent motivation to participate in therapy. Patient demonstrates deficits  in PFM coordination, PFM extensibility, posture, pain. Patient appreciative of educational session on female sexual response and biopsychosocial factors impacting sexual function during today's session and responded positively to educational interventions. Patient will benefit from continued skilled therapeutic intervention to address remaining deficits in PFM coordination, PFM extensibility, posture, pain in order to increase function and improve overall QOL.    PT Long Term Goals - 07/04/21 1500       PT LONG TERM GOAL #1   Title Patient will demonstrate independence with HEP in order to maximize therapeutic gains and improve carryover from physical therapy sessions to ADLs in the home and community.    Baseline IE: not initiated    Time 12    Period Weeks    Status New    Target Date 09/26/21      PT LONG TERM GOAL #2   Title Patient will demonstrate independent and coordinated diaphragmatic breathing in supine with a 1:2 breathing pattern for improved down-regulation of the nervous system and improved management of intra-abdominal pressures in order to increase function at home and in the community.    Baseline IE: not demonstrated    Time 12    Period Weeks    Status New    Target Date 09/26/21      PT LONG TERM GOAL #3   Title Patient will demonstrate improved function as evidenced by a score of 77 on FOTO measure for full participation  in activities at home and in the community.    Baseline IE: 69    Time 12    Period Weeks    Status New    Target Date 09/26/21      PT LONG TERM GOAL #4   Title Patient will decrease worst pain as reported on NPRS by at least 2 points to demonstrate clinically significant reduction in pain in order to restore/improve function and overall QOL.    Baseline IE: 8/10 (urination)    Time 12    Period Weeks    Status New    Target Date 09/26/21      PT LONG TERM GOAL #5   Title Patient will demonstrate understanding of basic  self-management/down-regulation of the nervous system for persistent pain condition and stress as evidenced by diaphragmatic breathing without cueing, body scan/progressive relaxation meditation, and improved sleep hygiene including in order to transition to independent management of patient's chief complaint: pelvic pain.    Baseline IE: not demonstrated    Time 12    Period Weeks    Status New    Target Date 09/26/21                   Plan - 08/23/21 1807     Clinical Impression Statement Patient presents to clinic with excellent motivation to participate in therapy. Patient demonstrates deficits in PFM coordination, PFM extensibility, posture, pain. Patient appreciative of educational session on female sexual response and biopsychosocial factors impacting sexual function during today's session and responded positively to educational interventions. Patient will benefit from continued skilled therapeutic intervention to address remaining deficits in PFM coordination, PFM extensibility, posture, pain in order to increase function and improve overall QOL.    Personal Factors and Comorbidities Behavior Pattern;Time since onset of injury/illness/exacerbation;Past/Current Experience    Examination-Activity Limitations Continence;Bend;Lift;Squat;Other    Examination-Participation Restrictions Community Activity;Interpersonal Relationship    Stability/Clinical Decision Making Evolving/Moderate complexity    Rehab Potential Fair    PT Frequency 1x / week    PT Duration 12 weeks    PT Treatment/Interventions ADLs/Self Care Home Management;Biofeedback;Cryotherapy;Electrical Stimulation;Moist Heat;Therapeutic activities;Therapeutic exercise;Neuromuscular re-education;Patient/family education;Orthotic Fit/Training;Manual techniques;Dry needling;Taping;Spinal Manipulations;Joint Manipulations;Scar mobilization;Compression bandaging    PT Next Visit Plan deep core, lumbar mobility, PFM progression     PT Home Exercise Plan diaphragmatic breathing, PFM lengthening mental imagery    Consulted and Agree with Plan of Care Patient             Patient will benefit from skilled therapeutic intervention in order to improve the following deficits and impairments:  Pain, Postural dysfunction, Improper body mechanics, Decreased coordination, Increased muscle spasms, Decreased activity tolerance  Visit Diagnosis: Pelvic pain  Other muscle spasm  Other lack of coordination     Problem List Patient Active Problem List   Diagnosis Date Noted   Recurrent UTI 08/03/2019   Acne vulgaris 08/03/2019   Irregular periods 08/03/2019    Myles Gip PT, DPT [phone]  08/23/2021, 6:10 PM  [facility] [address] [place], [place], [address] Phone: [phone]   Fax:  [phone]  Name: Susan Cooper MRN: [id number] Date of Birth: [birth year]

## 2021-08-30 ENCOUNTER — Ambulatory Visit: Admitting: Physical Therapy

## 2021-09-06 ENCOUNTER — Ambulatory Visit: Admitting: Physical Therapy

## 2021-09-11 ENCOUNTER — Ambulatory Visit: Attending: Urology | Admitting: Physical Therapy

## 2021-09-11 ENCOUNTER — Other Ambulatory Visit: Payer: Self-pay

## 2021-09-11 ENCOUNTER — Encounter: Payer: Self-pay | Admitting: Physical Therapy

## 2021-09-11 DIAGNOSIS — R102 Pelvic and perineal pain unspecified side: Secondary | ICD-10-CM

## 2021-09-11 DIAGNOSIS — R278 Other lack of coordination: Secondary | ICD-10-CM | POA: Diagnosis present

## 2021-09-11 DIAGNOSIS — M62838 Other muscle spasm: Secondary | ICD-10-CM | POA: Diagnosis present

## 2021-09-11 NOTE — Therapy (Signed)
[facility] [address] [place], [place], [address] Phone: [phone]   Fax:  [phone]  Physical Therapy Treatment  Patient Details  Name: Susan Cooper MRN: [id number] Date of Birth: 05-14-1991 Referring Provider (PT): Diamantina Providence   Encounter Date: 09/11/2021   PT End of Session - 09/11/21 1733     Visit Number 6    Number of Visits 12    Date for PT Re-Evaluation 09/26/21    Authorization Type IE 07/04/2021    PT Start Time 1720    PT Stop Time 1750    PT Time Calculation (min) 30 min    Activity Tolerance Patient tolerated treatment well    Behavior During Therapy [facility] for tasks assessed/performed             Past Medical History:  Diagnosis Date   Chronic kidney disease    Kidney stone    Urinary tract infection     Past Surgical History:  Procedure Laterality Date   WISDOM TOOTH EXTRACTION      There were no vitals filed for this visit.   Subjective Assessment - 09/11/21 1723     Subjective Patient notes a day about 2 weeks ago where she felt like she was getting a UTI and took anitbiotics. Patient used heating pad and lymphatic stimulation massage with good effect. Other than this instance, she notes no pain. Last night, had holiday party at work and did consume alcohol and had no increased bladder pain today.    Currently in Pain? No/denies               TREATMENT  Neuromuscular Re-education: Reassessed goals; see below.  Reviewed mobility HEP as well as PFM HEP.  Patient educated throughout session on appropriate technique and form using multi-modal cueing, HEP, and activity modification. Patient articulated understanding and returned demonstration.  Patient Response to interventions: Comfortable to return in 2023  ASSESSMENT Patient presents to clinic with excellent motivation to participate in therapy. Patient demonstrates deficits in PFM coordination, PFM extensibility, posture, pain.  Patient indicating modest progress toward goals as assessed by FOTO Urinary measure. However, patient has made considerable progress with respect to pain management with worst pain in the past week being rated at 4/10. Patient and DPT discussed practical management of symptoms (pain and PFM tension) by way of limiting stress and overscheduling. Patient and DPT in agreement to pause in-person visits with preference toward continued HEP until patient returns from work trip in February. Patient will benefit from continued skilled therapeutic intervention to address remaining deficits in PFM coordination, PFM extensibility, posture, pain in order to increase function and improve overall QOL.    PT Long Term Goals - 09/11/21 1737       PT LONG TERM GOAL #1   Title Patient will demonstrate independence with HEP in order to maximize therapeutic gains and improve carryover from physical therapy sessions to ADLs in the home and community.    Baseline IE: not initiated; 12/5: IND    Time 12    Period Weeks    Status Achieved    Target Date 09/26/21      PT LONG TERM GOAL #2   Title Patient will demonstrate independent and coordinated diaphragmatic breathing in supine with a 1:2 breathing pattern for improved down-regulation of the nervous system and improved management of intra-abdominal pressures in order to increase function at home and in the community.    Baseline IE: not demonstrated; 12/5: IND  Time 12    Period Weeks    Status Achieved    Target Date 09/26/21      PT LONG TERM GOAL #3   Title Patient will demonstrate improved function as evidenced by a score of 77 on FOTO measure for full participation in activities at home and in the community.    Baseline IE: 69; 12/5: 72    Time 12    Period Weeks    Status On-going    Target Date 09/26/21      PT LONG TERM GOAL #4   Title Patient will decrease worst pain as reported on NPRS by at least 2 points to demonstrate clinically significant  reduction in pain in order to restore/improve function and overall QOL.    Baseline IE: 8/10 (urination); 12/5: 4/10 (UTI?)    Time 12    Period Weeks    Status On-going    Target Date 09/26/21      PT LONG TERM GOAL #5   Title Patient will demonstrate understanding of basic self-management/down-regulation of the nervous system for persistent pain condition and stress as evidenced by diaphragmatic breathing without cueing, body scan/progressive relaxation meditation, and improved sleep hygiene including in order to transition to independent management of patient's chief complaint: pelvic pain.    Baseline IE: not demonstrated; 12/5: lymphatic stimulation, diaphragmatic breathing    Time 12    Period Weeks    Status Achieved    Target Date 09/26/21                   Plan - 09/11/21 1737     Clinical Impression Statement Patient presents to clinic with excellent motivation to participate in therapy. Patient demonstrates deficits in PFM coordination, PFM extensibility, posture, pain. Patient indicating modest progress toward goals as assessed by FOTO Urinary measure. However, patient has made considerable progress with respect to pain management with worst pain in the past week being rated at 4/10. Patient and DPT discussed practical management of symptoms (pain and PFM tension) by way of limiting stress and overscheduling. Patient and DPT in agreement to pause in-person visits with preference toward continued HEP until patient returns from work trip in February. Patient will benefit from continued skilled therapeutic intervention to address remaining deficits in PFM coordination, PFM extensibility, posture, pain in order to increase function and improve overall QOL.    Personal Factors and Comorbidities Behavior Pattern;Time since onset of injury/illness/exacerbation;Past/Current Experience    Examination-Activity Limitations Continence;Bend;Lift;Squat;Other    Examination-Participation  Restrictions Community Activity;Interpersonal Relationship    Stability/Clinical Decision Making Evolving/Moderate complexity    Rehab Potential Fair    PT Frequency 1x / week    PT Duration 12 weeks    PT Treatment/Interventions ADLs/Self Care Home Management;Biofeedback;Cryotherapy;Electrical Stimulation;Moist Heat;Therapeutic activities;Therapeutic exercise;Neuromuscular re-education;Patient/family education;Orthotic Fit/Training;Manual techniques;Dry needling;Taping;Spinal Manipulations;Joint Manipulations;Scar mobilization;Compression bandaging    PT Next Visit Plan deep core, lumbar mobility, PFM progression    PT Home Exercise Plan diaphragmatic breathing, PFM lengthening mental imagery    Consulted and Agree with Plan of Care Patient             Patient will benefit from skilled therapeutic intervention in order to improve the following deficits and impairments:  Pain, Postural dysfunction, Improper body mechanics, Decreased coordination, Increased muscle spasms, Decreased activity tolerance  Visit Diagnosis: Pelvic pain  Other muscle spasm  Other lack of coordination     Problem List Patient Active Problem List   Diagnosis Date Noted   Recurrent UTI 08/03/2019  Acne vulgaris 08/03/2019   Irregular periods 08/03/2019    Myles Gip PT, DPT [phone]  09/11/2021, 5:57 PM  [facility] [address] [place], [place], [address] Phone: [phone]   Fax:  [phone]  Name: Susan Cooper MRN: [id number] Date of Birth: Apr 11, 1991

## 2021-09-13 ENCOUNTER — Encounter: Payer: Self-pay | Admitting: Physical Therapy

## 2021-09-18 ENCOUNTER — Encounter: Payer: Self-pay | Admitting: Family Medicine

## 2021-09-18 ENCOUNTER — Other Ambulatory Visit: Payer: Self-pay | Admitting: Family Medicine

## 2021-09-18 MED ORDER — AMPHETAMINE-DEXTROAMPHET ER 20 MG PO CP24: 20.0000 mg | ORAL_CAPSULE | ORAL | 0 refills | Status: DC

## 2021-09-18 NOTE — Telephone Encounter (Signed)
Please advise on pt's request to increase Adderall dose. Thanks!

## 2021-09-19 ENCOUNTER — Ambulatory Visit (INDEPENDENT_AMBULATORY_CARE_PROVIDER_SITE_OTHER): Admitting: Obstetrics and Gynecology

## 2021-09-19 ENCOUNTER — Encounter: Payer: Self-pay | Admitting: Obstetrics and Gynecology

## 2021-09-19 ENCOUNTER — Other Ambulatory Visit: Payer: Self-pay

## 2021-09-19 VITALS — BP 105/68 | HR 69 | Resp 16 | Ht 67.0 in | Wt 135.2 lb

## 2021-09-19 DIAGNOSIS — Z8619 Personal history of other infectious and parasitic diseases: Secondary | ICD-10-CM

## 2021-09-19 NOTE — Progress Notes (Signed)
Reviewed patient's chart, was referred for colposcopy however most recent pap smear 08/08/21 was normal (no HPV testing).  Pap smear in prior year 08/03/2020 NILM but HR HPV positive (neg for 16/18/45).  Patient still overall low risk for cervical cancer. Does not need colposcopy today. Can repeat pap at normal interval (q 3 years), or can perform repeat HPV testing only in 1 year.   Rubie Maid, MD [facility]

## 2021-09-20 ENCOUNTER — Encounter: Payer: Self-pay | Admitting: Physical Therapy

## 2021-09-27 ENCOUNTER — Encounter: Payer: Self-pay | Admitting: Physical Therapy

## 2021-09-28 ENCOUNTER — Encounter: Payer: Self-pay | Admitting: Family Medicine

## 2021-10-03 ENCOUNTER — Ambulatory Visit: Admitting: Family Medicine

## 2021-10-03 ENCOUNTER — Other Ambulatory Visit: Payer: Self-pay | Admitting: Family Medicine

## 2021-10-03 MED ORDER — AMPHETAMINE-DEXTROAMPHET ER 20 MG PO CP24: 20.0000 mg | ORAL_CAPSULE | ORAL | 0 refills | Status: DC

## 2021-10-04 ENCOUNTER — Encounter: Payer: Self-pay | Admitting: Physical Therapy

## 2021-10-26 ENCOUNTER — Ambulatory Visit: Admitting: Urology

## 2021-11-01 ENCOUNTER — Encounter: Payer: Self-pay | Admitting: Family Medicine

## 2021-11-16 ENCOUNTER — Encounter: Payer: Self-pay | Admitting: Family Medicine

## 2021-11-16 ENCOUNTER — Other Ambulatory Visit: Payer: Self-pay

## 2021-11-16 ENCOUNTER — Ambulatory Visit (INDEPENDENT_AMBULATORY_CARE_PROVIDER_SITE_OTHER): Admitting: Family Medicine

## 2021-11-16 VITALS — BP 118/82 | HR 76 | Temp 97.3°F | Resp 18 | Ht 67.0 in | Wt 123.0 lb

## 2021-11-16 DIAGNOSIS — R1013 Epigastric pain: Secondary | ICD-10-CM | POA: Diagnosis not present

## 2021-11-16 LAB — COMPLETE METABOLIC PANEL WITH GFR
AG Ratio: 1.7 (calc) (ref 1.0–2.5)
ALT: 13 U/L (ref 6–29)
AST: 15 U/L (ref 10–30)
Albumin: 4.7 g/dL (ref 3.6–5.1)
Alkaline phosphatase (APISO): 43 U/L (ref 31–125)
BUN: 14 mg/dL (ref 7–25)
CO2: 27 mmol/L (ref 20–32)
Calcium: 9.7 mg/dL (ref 8.6–10.2)
Chloride: 102 mmol/L (ref 98–110)
Creat: 0.76 mg/dL (ref 0.50–0.97)
Globulin: 2.8 g/dL (calc) (ref 1.9–3.7)
Glucose, Bld: 88 mg/dL (ref 65–99)
Potassium: 4.3 mmol/L (ref 3.5–5.3)
Sodium: 136 mmol/L (ref 135–146)
Total Bilirubin: 0.3 mg/dL (ref 0.2–1.2)
Total Protein: 7.5 g/dL (ref 6.1–8.1)
eGFR: 107 mL/min/{1.73_m2} (ref >=60)

## 2021-11-16 MED ORDER — PANTOPRAZOLE SODIUM 40 MG PO TBEC: 40.0000 mg | DELAYED_RELEASE_TABLET | Freq: Two times a day (BID) | ORAL | 1 refills | Status: DC

## 2021-11-16 MED ORDER — SUCRALFATE 1 G PO TABS: 1.0000 g | ORAL_TABLET | Freq: Three times a day (TID) | ORAL | 0 refills | Status: DC

## 2021-11-16 NOTE — Progress Notes (Signed)
Subjective:    Patient ID: Susan Cooper, female    DOB: December 30, 1990, 31 y.o.   MRN: [id number]  HPI Patient is a very pleasant 31 year old Caucasian female who presents today for abdominal pain.  She states that for several weeks she has had epigastric abdominal pain.  It was intense.  At times she was unable to eat because of a burning pain in a horizontal bandlike fashion below her ribs.  She points to the center of her stomach.  She states that the burning pain was constant.  Around the same time she also experienced melena.  That has improved.  We discussed this over MyChart, the patient started Prilosec over-the-counter and her symptoms have improved 50% however she continues to report a burning epigastric abdominal pain.  She states that it is still there although it is better.  She denies any further melena or hematochezia.  She is taken a negative pregnancy test.  She denies any fevers or chills or vomiting.  She denies any right upper quadrant pain.  She does drink occasionally but socially and not very much.  She denies any history of pancreatitis.  She is not taking NSAIDs.  Past Medical History:  Diagnosis Date   Chronic kidney disease    Kidney stone    Urinary tract infection    Past Surgical History:  Procedure Laterality Date   WISDOM TOOTH EXTRACTION     Current Outpatient Medications on File Prior to Visit  Medication Sig Dispense Refill   amphetamine-dextroamphetamine (ADDERALL XR) 20 MG 24 hr capsule Take 1 capsule (20 mg total) by mouth every morning. 30 capsule 0   cephALEXin (KEFLEX) 250 MG capsule Take 1 capsule (250 mg total) by mouth once as needed for up to 1 dose. One table after intercourse 90 capsule 3   No current facility-administered medications on file prior to visit.   No Known Allergies Social History   Socioeconomic History   Marital status: Divorced    Spouse name: Not on file   Number of children: Not on file   Years of education: Not on file    Highest education level: Not on file  Occupational History   Not on file  Tobacco Use   Smoking status: Never   Smokeless tobacco: Never  Vaping Use   Vaping Use: Never used  Substance and Sexual Activity   Alcohol use: Yes    Comment: occasional    Drug use: No   Sexual activity: Yes    Birth control/protection: Other-see comments    Comment: 'pull out method'  Other Topics Concern   Not on file  Social History Narrative   Not on file   Social Determinants of Health   Financial Resource Strain: Not on file  Food Insecurity: Not on file  Transportation Needs: Not on file  Physical Activity: Not on file  Stress: Not on file  Social Connections: Not on file  Intimate Partner Violence: Not on file   Family History  Problem Relation Age of Onset   Thyroid disease Mother    Hypertension Mother    Hypertension Father    Melanoma Paternal Aunt    Thyroid disease Maternal Grandmother    Hematuria Paternal Grandmother    Kidney failure Paternal Grandmother    Kidney disease Paternal Grandfather    Kidney cancer Neg Hx    Breast cancer Neg Hx    Ovarian cancer Neg Hx    Colon cancer Neg Hx  Review of Systems  All other systems reviewed and are negative.     Objective:   Physical Exam Vitals reviewed.  Constitutional:      General: She is not in acute distress.    Appearance: Normal appearance. She is normal weight. She is not ill-appearing, toxic-appearing or diaphoretic.  Cardiovascular:     Rate and Rhythm: Normal rate and regular rhythm.     Heart sounds: Normal heart sounds. No murmur heard.   No friction rub. No gallop.  Pulmonary:     Effort: Pulmonary effort is normal. No respiratory distress.     Breath sounds: Normal breath sounds. No stridor. No wheezing, rhonchi or rales.  Abdominal:     General: Bowel sounds are normal. There is no distension.     Palpations: Abdomen is soft.     Tenderness: There is no abdominal tenderness. There is no  guarding or rebound.     Hernia: No hernia is present.    Neurological:     Mental Status: She is alert.          Assessment & Plan:  Epigastric abdominal pain - Plan: H. pylori breath test, COMPLETE METABOLIC PANEL WITH GFR, pantoprazole (PROTONIX) 40 MG tablet, sucralfate (CARAFATE) 1 g tablet Differential diagnosis includes gastritis, peptic ulcer disease, biliary colic, pancreatitis.  Based on her symptoms, I suspect that the patient likely has gastritis versus an ulcer.  Less likely would be gallstones.  I recommended that we switch to Protonix 40 mg twice a day and sucralfate 1 g p.o. 3 times daily and treat her empirically for an ulcer and then reassess in 2 weeks.  If symptoms or not improving at that point would consider an EGD.  I will check for H. pylori and also check a CMP to evaluate her liver function test.  I do not feel that the patient has pancreatitis based on lack of risk factors.  If they are elevated liver function test, I would recommend a right upper quadrant ultrasound to evaluate further.  Patient is comfortable with this plan

## 2021-11-17 ENCOUNTER — Encounter: Payer: Self-pay | Admitting: Family Medicine

## 2021-11-17 LAB — H. PYLORI BREATH TEST: H. pylori Breath Test: NOT DETECTED

## 2021-11-17 NOTE — Telephone Encounter (Signed)
LOV 11/16/21 Last refill 10/03/21, #30, 0 refills  Please review, thanks!

## 2021-11-20 ENCOUNTER — Other Ambulatory Visit: Payer: Self-pay | Admitting: Family Medicine

## 2021-11-20 MED ORDER — AMPHETAMINE-DEXTROAMPHET ER 20 MG PO CP24: 20.0000 mg | ORAL_CAPSULE | ORAL | 0 refills | Status: DC

## 2021-12-11 ENCOUNTER — Other Ambulatory Visit: Payer: Self-pay | Admitting: Family Medicine

## 2021-12-11 DIAGNOSIS — R1013 Epigastric pain: Secondary | ICD-10-CM

## 2022-01-01 ENCOUNTER — Other Ambulatory Visit: Payer: Self-pay | Admitting: Family Medicine

## 2022-01-02 ENCOUNTER — Other Ambulatory Visit: Payer: Self-pay

## 2022-01-02 ENCOUNTER — Encounter: Payer: Self-pay | Admitting: Family Medicine

## 2022-01-02 MED ORDER — AMPHETAMINE-DEXTROAMPHET ER 20 MG PO CP24: 20.0000 mg | ORAL_CAPSULE | ORAL | 0 refills | Status: DC

## 2022-01-02 NOTE — Telephone Encounter (Signed)
LOV 11/16/21 ?Last refill 11/20/21, #30, 0 refills ? ?Please review, thanks! ? ?

## 2022-01-05 MED ORDER — AMPHETAMINE-DEXTROAMPHET ER 20 MG PO CP24: 20.0000 mg | ORAL_CAPSULE | ORAL | 0 refills | Status: DC

## 2022-01-05 NOTE — Addendum Note (Signed)
Addended by: Colman Cater on: 01/05/2022 03:17 PM ? ? Modules accepted: Orders ? ?

## 2022-01-05 NOTE — Telephone Encounter (Signed)
Please resend order  ?LOV 11/16/21 ?Last refill 11/20/21, #30, 0 refills ?  ?Please review, thanks! ?  ?

## 2022-01-08 ENCOUNTER — Encounter: Payer: Self-pay | Admitting: Family Medicine

## 2022-01-10 ENCOUNTER — Other Ambulatory Visit: Payer: Self-pay

## 2022-01-10 NOTE — Telephone Encounter (Signed)
Please resend order  ? ?LOV 11/16/21 ?Last refill 11/20/21, #30, 0 refills ?  ?Please review, thanks  ?

## 2022-01-11 MED ORDER — AMPHETAMINE-DEXTROAMPHET ER 20 MG PO CP24: 20.0000 mg | ORAL_CAPSULE | ORAL | 0 refills | Status: DC

## 2022-02-02 ENCOUNTER — Ambulatory Visit (INDEPENDENT_AMBULATORY_CARE_PROVIDER_SITE_OTHER): Admitting: Family Medicine

## 2022-02-02 VITALS — BP 106/86 | HR 86 | Temp 97.6°F | Ht 67.0 in | Wt 129.8 lb

## 2022-02-02 DIAGNOSIS — R5383 Other fatigue: Secondary | ICD-10-CM

## 2022-02-02 DIAGNOSIS — R2242 Localized swelling, mass and lump, left lower limb: Secondary | ICD-10-CM

## 2022-02-02 NOTE — Progress Notes (Signed)
? ?Subjective:  ? ? Patient ID: Susan Cooper, female    DOB: 02/17/1991, 31 y.o.   MRN: [id number] ? ?HPI ? ?Patient is a very pleasant 31 year old Caucasian female who presents today with a mass in the upper medial left thigh just below the perineum.  A cylinder shaped and oblong and located horizontally to the femur.  Is not erythematous.  Its not tender.  However it is approximately 1 cm in diameter and about 4 to 5 cm long.  It is mobile.  I suspect the large vein perhaps thrombophlebitis however there is no significant redness or swelling.  The shape is abnormal to be a cyst or an abscess.  Patient has noticed it for a few days and it does not go away.  However she is not having any pain associated with it and there are no overlying skin changes.  The skin does have a slight purplish discoloration to it which again leads me to suspect a vein. ?He also complains of fatigue.  She has irregular periods that can be heavy at times.  She does have a family history of hypothyroidism. ? ?Past Medical History:  ?Diagnosis Date  ? Chronic kidney disease   ? Kidney stone   ? Urinary tract infection   ? ?Past Surgical History:  ?Procedure Laterality Date  ? WISDOM TOOTH EXTRACTION    ? ?Current Outpatient Medications on File Prior to Visit  ?Medication Sig Dispense Refill  ? amphetamine-dextroamphetamine (ADDERALL XR) 20 MG 24 hr capsule Take 1 capsule (20 mg total) by mouth every morning. 30 capsule 0  ? ?No current facility-administered medications on file prior to visit.  ? ?No Known Allergies ?Social History  ? ?Socioeconomic History  ? Marital status: Divorced  ?  Spouse name: Not on file  ? Number of children: Not on file  ? Years of education: Not on file  ? Highest education level: Not on file  ?Occupational History  ? Not on file  ?Tobacco Use  ? Smoking status: Never  ? Smokeless tobacco: Never  ?Vaping Use  ? Vaping Use: Never used  ?Substance and Sexual Activity  ? Alcohol use: Yes  ?  Comment: occasional   ?  Drug use: No  ? Sexual activity: Yes  ?  Birth control/protection: Other-see comments  ?  Comment: 'pull out method'  ?Other Topics Concern  ? Not on file  ?Social History Narrative  ? Not on file  ? ?Social Determinants of Health  ? ?Financial Resource Strain: Not on file  ?Food Insecurity: Not on file  ?Transportation Needs: Not on file  ?Physical Activity: Not on file  ?Stress: Not on file  ?Social Connections: Not on file  ?Intimate Partner Violence: Not on file  ? ?Family History  ?Problem Relation Age of Onset  ? Thyroid disease Mother   ? Hypertension Mother   ? Hypertension Father   ? Melanoma Paternal Aunt   ? Thyroid disease Maternal Grandmother   ? Hematuria Paternal Grandmother   ? Kidney failure Paternal Grandmother   ? Kidney disease Paternal Grandfather   ? Kidney cancer Neg Hx   ? Breast cancer Neg Hx   ? Ovarian cancer Neg Hx   ? Colon cancer Neg Hx   ? ? ? ?Review of Systems  ?All other systems reviewed and are negative. ? ?   ?Objective:  ? Physical Exam ?Vitals reviewed.  ?Constitutional:   ?   General: She is not in acute distress. ?  Appearance: Normal appearance. She is normal weight. She is not ill-appearing, toxic-appearing or diaphoretic.  ?Cardiovascular:  ?   Rate and Rhythm: Normal rate and regular rhythm.  ?   Heart sounds: Normal heart sounds. No murmur heard. ?  No friction rub. No gallop.  ?Pulmonary:  ?   Effort: Pulmonary effort is normal. No respiratory distress.  ?   Breath sounds: Normal breath sounds. No stridor. No wheezing, rhonchi or rales.  ?Abdominal:  ?   General: Bowel sounds are normal. There is no distension.  ?   Palpations: Abdomen is soft.  ?   Tenderness: There is no abdominal tenderness. There is no guarding or rebound.  ?   Hernia: No hernia is present.  ? ? ?Musculoskeletal:  ?     Legs: ? ?Neurological:  ?   Mental Status: She is alert.  ? ? ? ? ? ?   ?Assessment & Plan:  ?Fatigue, unspecified type - Plan: TSH, COMPLETE METABOLIC PANEL WITH GFR, CBC with  Differential/Platelet ? ?Mass of left lower extremity - Plan: [place] LT LOWER EXTREM LTD SOFT TISSUE NON VASCULAR ? ?I reassured the patient that I do not feel the mass is anything dangerous.  I suspect that it could be a large vein that is superficial and perhaps could represent thrombophlebitis.  Therefore I recommended Motrin and warm compresses.  I will obtain a soft tissue ultrasound of the area given the abnormal presentation.  Due to her fatigue I will check a TSH, CMP, and a CBC. ?

## 2022-02-03 LAB — COMPLETE METABOLIC PANEL WITH GFR
AG Ratio: 1.8 (calc) (ref 1.0–2.5)
ALT: 15 U/L (ref 6–29)
AST: 18 U/L (ref 10–30)
Albumin: 4.8 g/dL (ref 3.6–5.1)
Alkaline phosphatase (APISO): 47 U/L (ref 31–125)
BUN: 11 mg/dL (ref 7–25)
CO2: 24 mmol/L (ref 20–32)
Calcium: 9.7 mg/dL (ref 8.6–10.2)
Chloride: 103 mmol/L (ref 98–110)
Creat: 0.75 mg/dL (ref 0.50–0.97)
Globulin: 2.6 g/dL (calc) (ref 1.9–3.7)
Glucose, Bld: 83 mg/dL (ref 65–99)
Potassium: 4.3 mmol/L (ref 3.5–5.3)
Sodium: 137 mmol/L (ref 135–146)
Total Bilirubin: 0.5 mg/dL (ref 0.2–1.2)
Total Protein: 7.4 g/dL (ref 6.1–8.1)
eGFR: 109 mL/min/{1.73_m2} (ref >=60)

## 2022-02-03 LAB — CBC WITH DIFFERENTIAL/PLATELET
Absolute Monocytes: 442 cells/uL (ref 200–950)
Basophils Absolute: 92 cells/uL (ref 0–200)
Basophils Relative: 1 %
Eosinophils Absolute: 83 cells/uL (ref 15–500)
Eosinophils Relative: 0.9 %
HCT: 39.2 % (ref 35.0–45.0)
Hemoglobin: 12.6 g/dL (ref 11.7–15.5)
Lymphs Abs: 3220 cells/uL (ref 850–3900)
MCH: 29.2 pg (ref 27.0–33.0)
MCHC: 32.1 g/dL (ref 32.0–36.0)
MCV: 90.7 fL (ref 80.0–100.0)
MPV: 11.1 fL (ref 7.5–12.5)
Monocytes Relative: 4.8 %
Neutro Abs: 5364 cells/uL (ref 1500–7800)
Neutrophils Relative %: 58.3 %
Platelets: 294 10*3/uL (ref 140–400)
RBC: 4.32 10*6/uL (ref 3.80–5.10)
RDW: 12.4 % (ref 11.0–15.0)
Total Lymphocyte: 35 %
WBC: 9.2 10*3/uL (ref 3.8–10.8)

## 2022-02-03 LAB — TSH: TSH: 1.58 mIU/L

## 2022-02-06 ENCOUNTER — Other Ambulatory Visit: Payer: Self-pay | Admitting: Family Medicine

## 2022-02-06 ENCOUNTER — Encounter: Payer: Self-pay | Admitting: Family Medicine

## 2022-02-06 MED ORDER — AMPHETAMINE-DEXTROAMPHET ER 20 MG PO CP24: 20.0000 mg | ORAL_CAPSULE | ORAL | 0 refills | Status: DC

## 2022-02-06 NOTE — Telephone Encounter (Signed)
LOV 02/02/22 ?Last refill 01/11/22, #30, 0 refills ? ?Please review, thanks! ? ?

## 2022-02-28 ENCOUNTER — Ambulatory Visit
Admission: RE | Admit: 2022-02-28 | Discharge: 2022-02-28 | Disposition: A | Discharge status: Home / Self Care | Source: Ambulatory Visit | Attending: Family Medicine | Admitting: Family Medicine

## 2022-02-28 DIAGNOSIS — R2242 Localized swelling, mass and lump, left lower limb: Secondary | ICD-10-CM | POA: Insufficient documentation

## 2022-03-08 ENCOUNTER — Encounter: Payer: Self-pay | Admitting: Family Medicine

## 2022-03-09 ENCOUNTER — Other Ambulatory Visit: Payer: Self-pay | Admitting: Family Medicine

## 2022-03-09 MED ORDER — AMPHETAMINE-DEXTROAMPHETAMINE 10 MG PO TABS: 10.0000 mg | ORAL_TABLET | Freq: Two times a day (BID) | ORAL | 0 refills | Status: DC

## 2022-04-05 ENCOUNTER — Other Ambulatory Visit: Payer: Self-pay | Admitting: Family Medicine

## 2022-04-05 NOTE — Telephone Encounter (Signed)
LOV 02/02/22 Last refill 03/09/22, #60, 0 refills  Please review, thanks!

## 2022-04-06 MED ORDER — AMPHETAMINE-DEXTROAMPHETAMINE 10 MG PO TABS: 10.0000 mg | ORAL_TABLET | Freq: Two times a day (BID) | ORAL | 0 refills | Status: DC

## 2022-04-20 ENCOUNTER — Ambulatory Visit (INDEPENDENT_AMBULATORY_CARE_PROVIDER_SITE_OTHER): Admitting: Family Medicine

## 2022-04-20 VITALS — BP 118/80 | HR 72 | Temp 97.9°F | Ht 67.0 in | Wt 126.0 lb

## 2022-04-20 DIAGNOSIS — D1724 Benign lipomatous neoplasm of skin and subcutaneous tissue of left leg: Secondary | ICD-10-CM | POA: Diagnosis not present

## 2022-04-20 DIAGNOSIS — Z808 Family history of malignant neoplasm of other organs or systems: Secondary | ICD-10-CM

## 2022-04-20 NOTE — Progress Notes (Signed)
Subjective:    Patient ID: Susan Cooper, female    DOB: 07/30/1991, 31 y.o.   MRN: [id number]  HPI 02/02/22 Patient is a very pleasant 30 year old Caucasian female who presents today with a mass in the upper medial left thigh just below the perineum.  A cylinder shaped and oblong and located horizontally to the femur.  Is not erythematous.  Its not tender.  However it is approximately 1 cm in diameter and about 4 to 5 cm long.  It is mobile.  I suspect the large vein perhaps thrombophlebitis however there is no significant redness or swelling.  The shape is abnormal to be a cyst or an abscess.  Patient has noticed it for a few days and it does not go away.  However she is not having any pain associated with it and there are no overlying skin changes.  The skin does have a slight purplish discoloration to it which again leads me to suspect a vein. He also complains of fatigue.  She has irregular periods that can be heavy at times.  She does have a family history of hypothyroidism.  At that time, my plan was:  I reassured the patient that I do not feel the mass is anything dangerous.  I suspect that it could be a large vein that is superficial and perhaps could represent thrombophlebitis.  Therefore I recommended Motrin and warm compresses.  I will obtain a soft tissue ultrasound of the area given the abnormal presentation.  Due to her fatigue I will check a TSH, CMP, and a CBC.  04/20/22 [place] confirmed lipoma (1.7x3.5 cm).  Patient would like to see a surgeon to have the lipoma removed.  She also has a strong family history of melanoma in her father and in her aunt.  He is questioning whether she would benefit from having a dermatologist perform annual skin exam  Past Medical History:  Diagnosis Date   Chronic kidney disease    Kidney stone    Urinary tract infection    Past Surgical History:  Procedure Laterality Date   WISDOM TOOTH EXTRACTION     Current Outpatient Medications on File Prior  to Visit  Medication Sig Dispense Refill   amphetamine-dextroamphetamine (ADDERALL) 10 MG tablet Take 1 tablet (10 mg total) by mouth 2 (two) times daily with a meal. 60 tablet 0   No current facility-administered medications on file prior to visit.   No Known Allergies Social History   Socioeconomic History   Marital status: Divorced    Spouse name: Not on file   Number of children: Not on file   Years of education: Not on file   Highest education level: Not on file  Occupational History   Not on file  Tobacco Use   Smoking status: Never   Smokeless tobacco: Never  Vaping Use   Vaping Use: Never used  Substance and Sexual Activity   Alcohol use: Yes    Comment: occasional    Drug use: No   Sexual activity: Yes    Birth control/protection: Other-see comments    Comment: 'pull out method'  Other Topics Concern   Not on file  Social History Narrative   Not on file   Social Determinants of Health   Financial Resource Strain: Not on file  Food Insecurity: Not on file  Transportation Needs: Not on file  Physical Activity: Not on file  Stress: Not on file  Social Connections: Not on file  Intimate Partner Violence: Not  on file   Family History  Problem Relation Age of Onset   Thyroid disease Mother    Hypertension Mother    Hypertension Father    Melanoma Paternal Aunt    Thyroid disease Maternal Grandmother    Hematuria Paternal Grandmother    Kidney failure Paternal Grandmother    Kidney disease Paternal Grandfather    Kidney cancer Neg Hx    Breast cancer Neg Hx    Ovarian cancer Neg Hx    Colon cancer Neg Hx      Review of Systems  All other systems reviewed and are negative.      Objective:   Physical Exam Vitals reviewed.  Constitutional:      General: She is not in acute distress.    Appearance: Normal appearance. She is normal weight. She is not ill-appearing, toxic-appearing or diaphoretic.  Cardiovascular:     Rate and Rhythm: Normal rate  and regular rhythm.     Heart sounds: Normal heart sounds. No murmur heard.    No friction rub. No gallop.  Pulmonary:     Effort: Pulmonary effort is normal. No respiratory distress.     Breath sounds: Normal breath sounds. No stridor. No wheezing, rhonchi or rales.  Musculoskeletal:       Legs:  Neurological:     Mental Status: She is alert.           Assessment & Plan:  Lipoma of left lower extremity - Plan: Ambulatory referral to General Surgery  Family history of malignant melanoma - Plan: Ambulatory referral to Dermatology I believe seeing a dermatologist once a year for an annual skin exam is a good idea in a patient with a strong family history of malignant melanoma.  I will consult dermatology.  Likewise I will send the patient to a general surgeon to have the lipoma removed.

## 2022-04-24 ENCOUNTER — Telehealth: Payer: Self-pay

## 2022-04-24 NOTE — Telephone Encounter (Signed)
Received a call from [facility] stating that they received a referral for this pt. The center called to state that they are not taking any new pts at this time, and to please try to send the referral to another office for pt.

## 2022-04-25 NOTE — Telephone Encounter (Signed)
Already notify referrals dept to have referral sent to a different place on 04/25/22

## 2022-05-14 ENCOUNTER — Other Ambulatory Visit: Payer: Self-pay | Admitting: Family Medicine

## 2022-05-14 ENCOUNTER — Encounter: Payer: Self-pay | Admitting: Family Medicine

## 2022-05-14 MED ORDER — AMPHETAMINE-DEXTROAMPHET ER 25 MG PO CP24: 25.0000 mg | ORAL_CAPSULE | ORAL | 0 refills | Status: DC

## 2022-05-21 ENCOUNTER — Ambulatory Visit (INDEPENDENT_AMBULATORY_CARE_PROVIDER_SITE_OTHER): Admitting: Surgery

## 2022-05-21 ENCOUNTER — Encounter: Payer: Self-pay | Admitting: Surgery

## 2022-05-21 VITALS — BP 114/77 | HR 69 | Temp 98.0°F | Ht 67.0 in | Wt 126.8 lb

## 2022-05-21 DIAGNOSIS — D1724 Benign lipomatous neoplasm of skin and subcutaneous tissue of left leg: Secondary | ICD-10-CM

## 2022-05-21 NOTE — Patient Instructions (Addendum)
If you have any concerns or questions, please feel free to call our office. See follow up appointment below.   Lipoma  A lipoma is a noncancerous (benign) tumor that is made up of fat cells. This is a very common type of soft-tissue growth. Lipomas are usually found under the skin (subcutaneous). They may occur in any tissue of the body that contains fat. Common areas for lipomas to appear include the back, arms, shoulders, buttocks, and thighs. Lipomas grow slowly, and they are usually painless. Most lipomas do not cause problems and do not require treatment. What are the causes? The cause of this condition is not known. What increases the risk? You are more likely to develop this condition if: You are 40-60 years old. You have a family history of lipomas. What are the signs or symptoms? A lipoma usually appears as a small, round bump under the skin. In most cases, the lump will: Feel soft or rubbery. Not cause pain or other symptoms. However, if a lipoma is located in an area where it pushes on nerves, it can become painful or cause other symptoms. How is this diagnosed? A lipoma can usually be diagnosed with a physical exam. You may also have tests to confirm the diagnosis and to rule out other conditions. Tests may include: Imaging tests, such as a CT scan or an MRI. Removal of a tissue sample to be looked at under a microscope (biopsy). How is this treated? Treatment for this condition depends on the size of the lipoma and whether it is causing any symptoms. For small lipomas that are not causing problems, no treatment is needed. If a lipoma is bigger or it causes problems, surgery may be done to remove the lipoma. Lipomas can also be removed to improve appearance. Most often, the procedure is done after applying a medicine that numbs the area (local anesthetic). Liposuction may be done to reduce the size of the lipoma before it is removed through surgery, or it may be done to remove  the lipoma. Lipomas are removed with this method to limit incision size and scarring. A liposuction tube is inserted through a small incision into the lipoma, and the contents of the lipoma are removed through the tube with suction. Follow these instructions at home: Watch your lipoma for any changes. Keep all follow-up visits. This is important. Where to find more information OrthoInfo: orthoinfo.aaos.org Contact a health care provider if: Your lipoma becomes larger or hard. Your lipoma becomes painful, red, or increasingly swollen. These could be signs of infection or a more serious condition. Get help right away if: You develop tingling or numbness in an area near the lipoma. This could indicate that the lipoma is causing nerve damage. Summary A lipoma is a noncancerous tumor that is made up of fat cells. Most lipomas do not cause problems and do not require treatment. If a lipoma is bigger or it causes problems, surgery may be done to remove the lipoma. Contact a health care provider if your lipoma becomes larger or hard, or if it becomes painful, red, or increasingly swollen. These could be signs of infection or a more serious condition. This information is not intended to replace advice given to you by your health care provider. Make sure you discuss any questions you have with your health care provider. Document Revised: 10/13/2021 Document Reviewed: 10/13/2021 Elsevier Patient Education  2023 Elsevier Inc.  

## 2022-05-21 NOTE — Progress Notes (Signed)
05/21/2022  Reason for Visit:  Lipoma of left upper inner thigh  Requesting Provider:  Jenna Luo, MD  History of Present Illness: Susan Cooper is a 31 y.o. female presenting for evaluation of a left upper inner thigh lipoma.  The patient reports noticing this at least 4 months ago and feels that it is getting bigger.  Reports that she has had an episode where the anterior thigh became numb and then had some tingling/needles sensation, and now the mass itself can cause discomfort and tenderness when pushing on it.  It's in a location that walking can aggravate it due to friction.  She had an U/S through her PCP on 02/28/22 which showed a subcutaneous mass measuring 3.5 x 0.6 x 1.7 cm.  She has been referred to [place] for evaluation for excision.  The patient wishes to have this excised due to the symptoms it is causing.  Past Medical History: Past Medical History:  Diagnosis Date   Chronic kidney disease    Kidney stone    Urinary tract infection      Past Surgical History: Past Surgical History:  Procedure Laterality Date   WISDOM TOOTH EXTRACTION      Home Medications: Prior to Admission medications   Medication Sig Start Date End Date Taking? Authorizing Provider  amphetamine-dextroamphetamine (ADDERALL XR) 25 MG 24 hr capsule Take 1 capsule by mouth every morning. 05/14/22  Yes Susy Frizzle, MD    Allergies: No Known Allergies  Social History:  reports that she has never smoked. She has never used smokeless tobacco. She reports current alcohol use. She reports that she does not use drugs.   Family History: Family History  Problem Relation Age of Onset   Thyroid disease Mother    Hypertension Mother    Hypertension Father    Melanoma Paternal Aunt    Thyroid disease Maternal Grandmother    Hematuria Paternal Grandmother    Kidney failure Paternal Grandmother    Kidney disease Paternal Grandfather    Kidney cancer Neg Hx    Breast cancer Neg Hx    Ovarian  cancer Neg Hx    Colon cancer Neg Hx     Review of Systems: Review of Systems  Constitutional:  Negative for chills and fever.  Respiratory:  Negative for shortness of breath.   Cardiovascular:  Negative for chest pain.  Gastrointestinal:  Negative for abdominal pain, nausea and vomiting.  Skin:  Negative for rash.       Left upper inner thigh mass    Physical Exam BP 114/77   Pulse 69   Temp 98 F (36.7 C) (Oral)   Ht '5\' 7"'$  (1.702 m)   Wt 126 lb 12.8 oz (57.5 kg)   LMP 05/02/2022   SpO2 100%   BMI 19.86 kg/m  CONSTITUTIONAL: No acute distress, well nourished. HEENT:  Normocephalic, atraumatic, extraocular motion intact. RESPIRATORY:  Normal respiratory effort without pathologic use of accessory muscles. CARDIOVASCULAR: Regular rhythm and rate. MUSCULOSKELETAL:  Normal muscle strength and tone in all four extremities.  No peripheral edema or cyanosis. SKIN: The patient has an approximately 3 x 1.5 cm mass in the left upper inner thigh, medial to the adductor longus muscle and running almost perpendicular to it.  It is soft and mobile, with some discomfort to palpation.   NEUROLOGIC:  Motor and sensation is grossly normal.  Cranial nerves are grossly intact. PSYCH:  Alert and oriented to person, place and time. Affect is normal.  Laboratory Analysis: Labs  from 02/02/22: Na 137, K 4.3, Cl 103, CO2 24, BUN 11, Cr 0.75.  WBC 9.2, Hgb 12.6, Hct 39.2, Plt 294.  Imaging: U/S Left lower extremity 02/28/22: IMPRESSION: 1. No evidence of femoropopliteal DVT within the LEFT lower extremity. 2. Palpable mass at the medial LEFT upper thigh shows a 3.5 cm long nonvascular subcutaneous lesion, likely to represent a lipoma.  Assessment and Plan: This is a 31 y.o. female with a left upper inner thigh lipoma  --Discussed with the patient that on ultrasound and exam, the findings appear consistent with lipoma.  These are benign masses of the fatty layer and are typically slow growing.   However, they can cause symptoms as they grow or if they push on specific tissue such as nerves.  Given her symptoms, I think it is reasonable to proceed with excision as she's requesting.  She reports she has a history of melanoma in her family, but discussed with her that melanoma and lipomas are very different entities, and lipoma has no risk of cancer. --Discussed with her the plan for excision of the left upper inner thigh lipoma.  Gave the patient options for excision in the office vs in the operating room under anesthesia, and the patient would rather do in office.  Reviewed the procedure at length including risks of bleeding, infection, injury to surrounding structures, activity restrictions, and she's willing to proceed. --Will schedule her for two weeks.  I spent 30 minutes dedicated to the care of this patient on the date of this encounter to include pre-visit review of records, face-to-face time with the patient discussing diagnosis and management, and any post-visit coordination of care.   Melvyn Neth, [facility]

## 2022-05-22 ENCOUNTER — Encounter: Payer: Self-pay | Admitting: Family Medicine

## 2022-05-24 ENCOUNTER — Telehealth: Payer: Self-pay | Admitting: Surgery

## 2022-05-24 ENCOUNTER — Other Ambulatory Visit: Payer: Self-pay | Admitting: Family Medicine

## 2022-05-24 DIAGNOSIS — Z1283 Encounter for screening for malignant neoplasm of skin: Secondary | ICD-10-CM

## 2022-05-24 NOTE — Telephone Encounter (Signed)
In office procedure for excision lipoma removal, per Dr. Hampton Abbot will charge cpt code [id number].  I called the [employer], spoke with Kennyth Lose T. And she informed me that no authorization was needed for this procedure and that they do have a referral on file for [facility] general surgery and that a new referral was not needed.   Patient is informed of this.

## 2022-06-04 ENCOUNTER — Ambulatory Visit (INDEPENDENT_AMBULATORY_CARE_PROVIDER_SITE_OTHER): Admitting: Surgery

## 2022-06-04 ENCOUNTER — Encounter: Payer: Self-pay | Admitting: Surgery

## 2022-06-04 ENCOUNTER — Other Ambulatory Visit: Payer: Self-pay

## 2022-06-04 ENCOUNTER — Other Ambulatory Visit: Payer: Self-pay | Admitting: Surgery

## 2022-06-04 VITALS — BP 109/72 | HR 74 | Temp 98.1°F | Ht 67.0 in | Wt 125.0 lb

## 2022-06-04 DIAGNOSIS — D1724 Benign lipomatous neoplasm of skin and subcutaneous tissue of left leg: Secondary | ICD-10-CM | POA: Diagnosis not present

## 2022-06-04 NOTE — Progress Notes (Signed)
  Procedure Date:  06/04/2022  Pre-operative Diagnosis:  Left thigh lipoma  Post-operative Diagnosis:  Left thigh lipoma, 4 cm size  Procedure:  Excision of left thigh lipoma  Surgeon:  Melvyn Neth, MD  Anesthesia:  8 ml of 1% lidocaine with epi  Estimated Blood Loss:  5 ml  Specimens:  Left thigh lipoma  Complications:  None  Indications for Procedure:  This is a 31 y.o. female with diagnosis of a symptomatic left thigh lipoma.  The patient wishes to have this excised. The risks of bleeding, abscess or infection, injury to surrounding structures, and need for further procedures were all discussed with the patient and she was willing to proceed.  Description of Procedure: The patient was correctly identified at bedside.  The patient was placed supine.  Appropriate time-outs were performed.  The patient's left upper inner thigh was prepped and draped in usual sterile fashion.  Local anesthetic was infused intradermally.  A 5 cm incision was made over the lipoma, and scalpel was used to dissect down the skin and subcutaneous tissue.  Skin flaps were created sharply, and then the lipoma was excised intact.  It was sent off to pathology.  The cavity was then irrigated and hemostasis was assured with manual pressure.  The wound was then closed in two layers using 3-0 Vicryl and 4-0 Monocryl.  The incision was cleaned and sealed with DermaBond.  The patient tolerated the procedure well and all sharps were appropriately disposed of at the end of the case.  --Patient may shower tomorrow. --May take Ibuprofen and Tylenol for pain control --Activity restrictions reviewed. --Follow up in 10 days.  Melvyn Neth, MD

## 2022-06-04 NOTE — Patient Instructions (Signed)
Today we have removed a Lipoma in our office. Please see information below regarding this type of tumor.  You are free to shower in 24 hours. This will be on 06/05/22.  You have glue on your skin and sutures under the skin. The glue will come off on it's own in 10-14 days. You may shower normally until this occurs but do not submerge.  Please use Tylenol or Ibuprofen for pain as needed.  We will see you back in 2 weeks to ensure that this has healed and to review the final pathology. Please see your appointment below. You may continue your regular activities right away but if you are having pain while doing something, stop what you are doing and try this activity once again in 3 days. Please call our office with any questions or concerns prior to your appointment.   Lipoma Removal Lipoma removal is a surgical procedure to remove a noncancerous (benign) tumor that is made up of fat cells (lipoma). Most lipomas are small and painless and do not require treatment. They can form in many areas of the body but are most common under the skin of the back, shoulders, arms, and thighs. You may need lipoma removal if you have a lipoma that is large, growing, or causing discomfort. Lipoma removal may also be done for cosmetic reasons. Tell a health care provider about: Any allergies you have. All medicines you are taking, including vitamins, herbs, eye drops, creams, and over-the-counter medicines. Any problems you or family members have had with anesthetic medicines. Any blood disorders you have. Any surgeries you have had. Any medical conditions you have. Whether you are pregnant or may be pregnant. What are the risks? Generally, this is a safe procedure. However, problems may occur, including: Infection. Bleeding. Allergic reactions to medicines. Damage to nerves or blood vessels near the lipoma. Scarring.  What happens before the procedure? Staying hydrated Follow instructions from your health  care provider about hydration, which may include: Up to 2 hours before the procedure - you may continue to drink clear liquids, such as water, clear fruit juice, black coffee, and plain tea.  Eating and drinking restrictions Follow instructions from your health care provider about eating and drinking, which may include: 8 hours before the procedure - stop eating heavy meals or foods such as meat, fried foods, or fatty foods. 6 hours before the procedure - stop eating light meals or foods, such as toast or cereal. 6 hours before the procedure - stop drinking milk or drinks that contain milk. 2 hours before the procedure - stop drinking clear liquids.  Medicines Ask your health care provider about: Changing or stopping your regular medicines. This is especially important if you are taking diabetes medicines or blood thinners. Taking medicines such as aspirin and ibuprofen. These medicines can thin your blood. Do not take these medicines before your procedure if your health care provider instructs you not to. You may be given antibiotic medicine to help prevent infection. General instructions Ask your health care provider how your surgical site will be marked or identified. You will have a physical exam. Your health care provider will check the size of the lipoma and whether it can be moved easily. You may have imaging tests, such as: X-rays. CT scan. MRI. Plan to have someone take you home from the hospital or clinic. What happens during the procedure? To reduce your risk of infection: Your health care team will wash or sanitize their hands. Your skin will  be washed with soap. You will be given one or more of the following: A medicine to help you relax (sedative). A medicine to numb the area (local anesthetic). A medicine to make you fall asleep (general anesthetic). A medicine that is injected into an area of your body to numb everything below the injection site (regional  anesthetic). An incision will be made over the lipoma or very near the lipoma. The incision may be made in a natural skin line or crease. Tissues, nerves, and blood vessels near the lipoma will be moved out of the way. The lipoma and the capsule that surrounds it will be separated from the surrounding tissues. The lipoma will be removed. The incision may be closed with stitches (sutures). A bandage (dressing) will be placed over the incision. What happens after the procedure? Do not drive for 24 hours if you received a sedative. Your blood pressure, heart rate, breathing rate, and blood oxygen level will be monitored until the medicines you were given have worn off. This information is not intended to replace advice given to you by your health care provider. Make sure you discuss any questions you have with your health care provider. Document Released: 12/08/2015 Document Revised: 03/01/2016 Document Reviewed: 12/08/2015 Elsevier Interactive Patient Education  [employer].

## 2022-06-13 ENCOUNTER — Other Ambulatory Visit: Payer: Self-pay | Admitting: Family Medicine

## 2022-06-13 ENCOUNTER — Encounter: Payer: Self-pay | Admitting: Surgery

## 2022-06-13 ENCOUNTER — Ambulatory Visit (INDEPENDENT_AMBULATORY_CARE_PROVIDER_SITE_OTHER): Admitting: Surgery

## 2022-06-13 VITALS — BP 104/66 | HR 80 | Temp 98.4°F | Ht 67.0 in | Wt 126.0 lb

## 2022-06-13 DIAGNOSIS — D1724 Benign lipomatous neoplasm of skin and subcutaneous tissue of left leg: Secondary | ICD-10-CM

## 2022-06-13 NOTE — Patient Instructions (Addendum)
The wound is mildly pink and has a thick firm ridge underneath it, this is normal, and is referred to as a healing ridge.  This will resolve over the next 4-6 weeks.  SUNBLOCK Use sun block to incision area over the next year if this area will be exposed to sun. This helps decrease scarring and will allow you avoid a permanent darkened area over your incision.  May rub Vitamin-E oil or other emmolient agent in area 2-3 times a day to soften.  Wait for a full 4 weeks after your surgery to do extreme/strenuous exercises like running.  Follow-up with our office as needed.  Please call and ask to speak with a nurse if you develop questions or concerns.   QUESTIONS:  Please feel free to call our office if you have any questions, and we will be glad to assist you. [phone]

## 2022-06-13 NOTE — Progress Notes (Signed)
06/13/2022  HPI: Susan Cooper is a 31 y.o. female s/p excision of left thigh lipoma on 06/04/22.  She presents today for follow up.  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  She reports some initial soreness that's improving as well as bruising that is also improving.  There is a bit of firmness at the inferior corner of the incision, but otherwise no dehiscence or drainage.  Vital signs: BP 104/66   Pulse 80   Temp 98.4 F (36.9 C)   Ht '5\' 7"'$  (1.702 m)   Wt 126 lb (57.2 kg)   LMP 04/30/2022 (Exact Date)   SpO2 98%   BMI 19.73 kg/m    Physical Exam: Constitutional: No acute distress Skin:  The left thigh incision is healing well, without any dehiscence or evidence of infection.  DermaBond peeled off already.  There is some ecchymosis distal to the were the incision is, which is resolving.  The scar line itself has some firmness to it, consistent with scar formation.  Assessment/Plan: This is a 31 y.o. female s/p excision of left thigh lipoma.  --Discussed with the patient that the firmness she can palpate is the scar tissue and this should improve at the wound continues to heal and the scar starts remodeling.  She can start applying vitamin E lotion or cocoa butter lotion to help. --Discussed pathology results with patient. --Reviewed some activity restrictions based on the location of the incision.  Given work note for this as well. --Follow up as needed.   Melvyn Neth, [facility]

## 2022-06-15 MED ORDER — AMPHETAMINE-DEXTROAMPHET ER 25 MG PO CP24: 25.0000 mg | ORAL_CAPSULE | ORAL | 0 refills | Status: DC

## 2022-07-17 IMAGING — US US EXTREM LOW VENOUS*L*
1 series · 13 of 24 positions shown · non-contrast
Comparison: None Available.

CLINICAL DATA: Swelling medial LEFT upper thigh.

EXAM:
LEFT LOWER EXTREMITY VENOUS DOPPLER ULTRASOUND
TECHNIQUE: Gray-scale sonography with compression, as well as color and duplex
ultrasound, were performed to evaluate the deep venous system(s)
from the level of the common femoral vein through the popliteal and
proximal calf veins.

[Series 1: us venous img lower uni left (dvt) · portal-venous · 48 acquisitions, 13 frames shown]
[im 1/48]
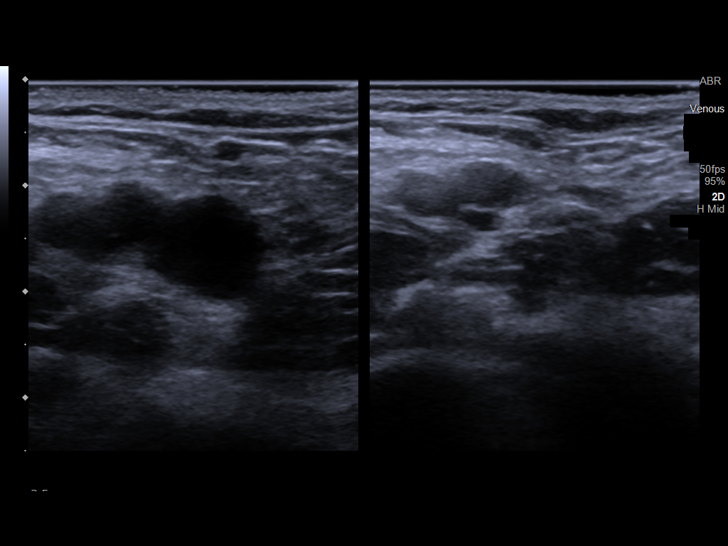
[im 5/48]
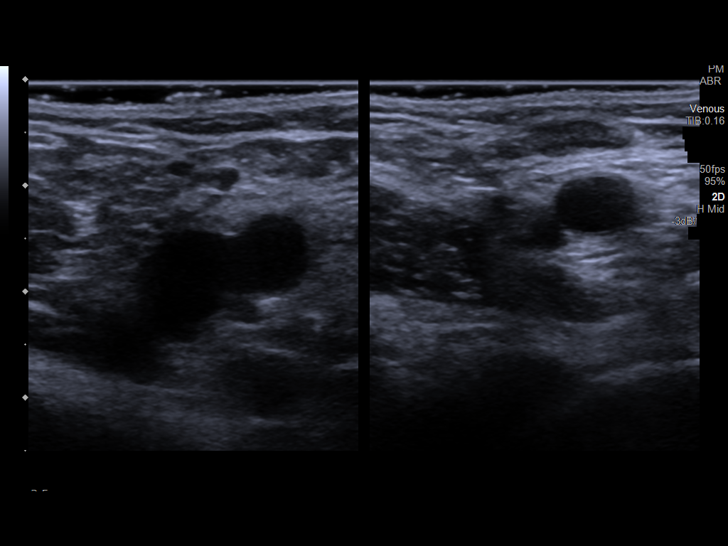
[im 9/48]
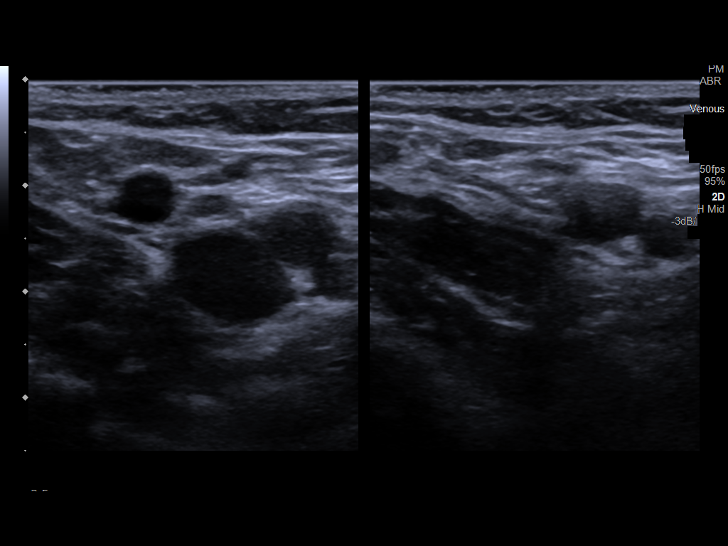
[im 13/48]
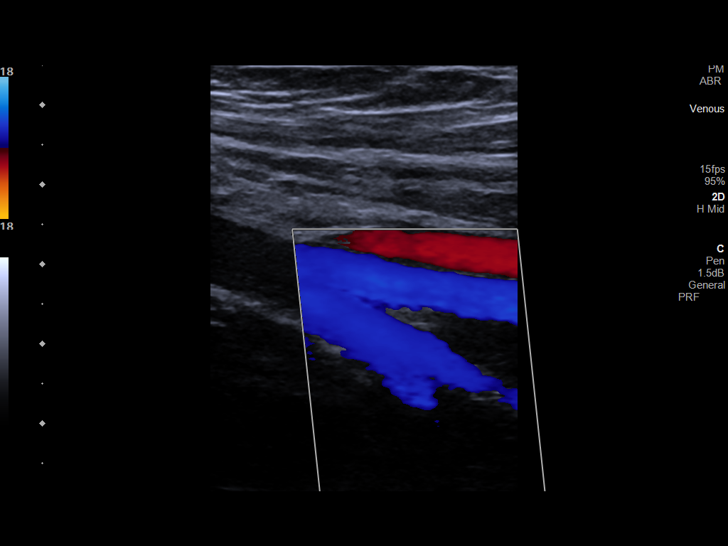
[im 17/48]
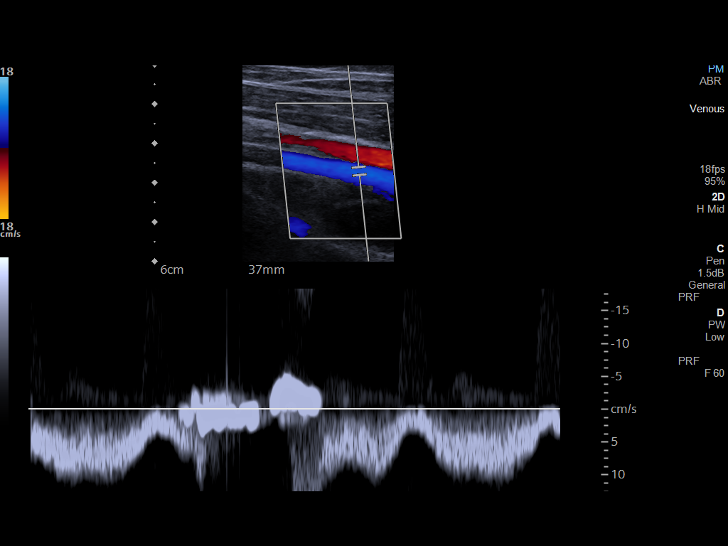
[im 21/48]
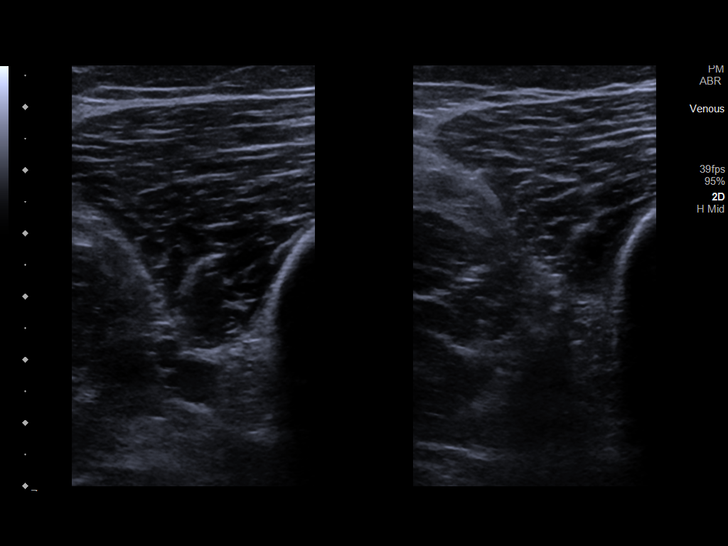
[im 27/48]
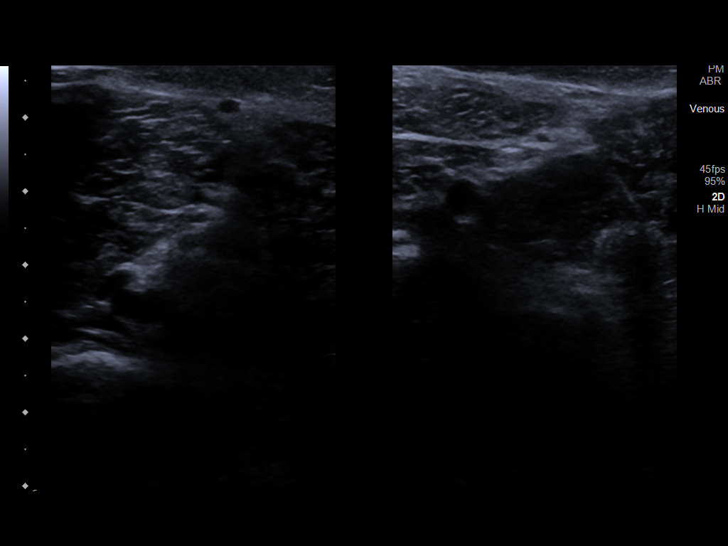
[im 29/48]
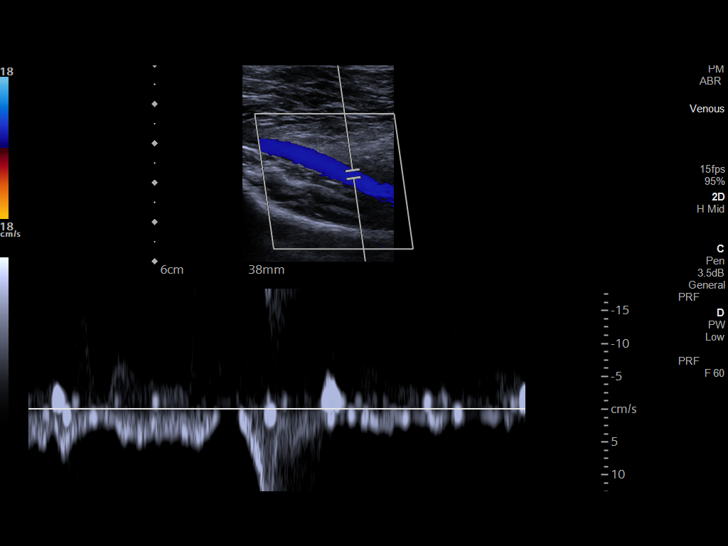
[im 33/48]
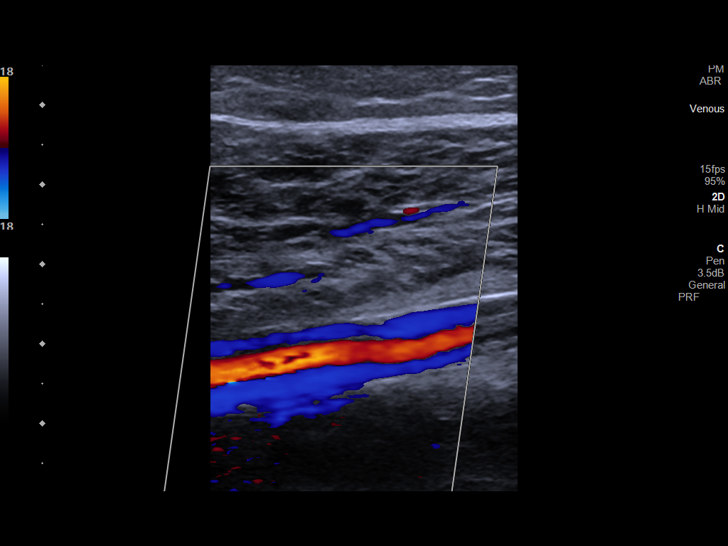
[im 37/48]
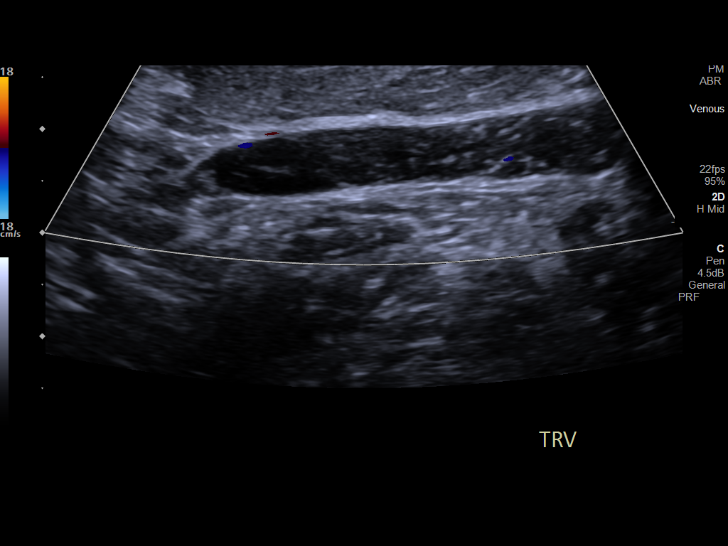
[im 41/48]
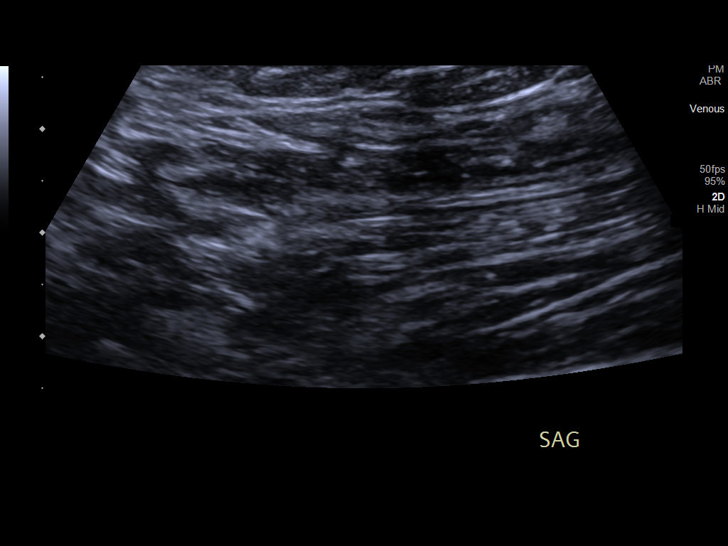
[im 45/48]
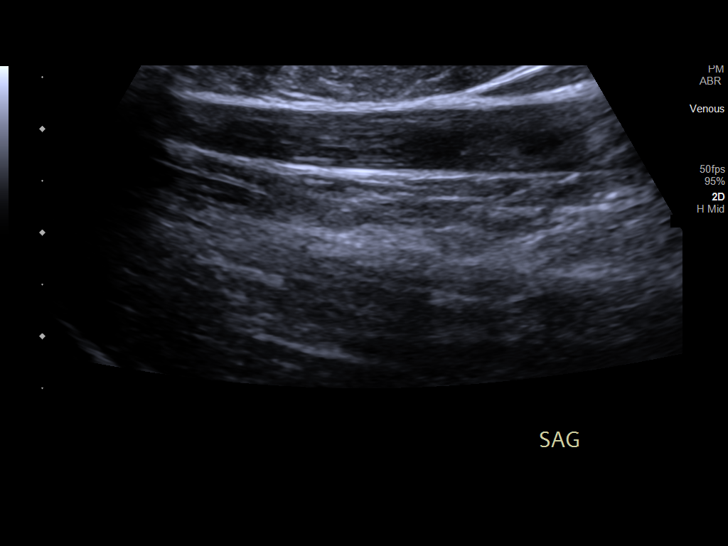
[im 48/48]
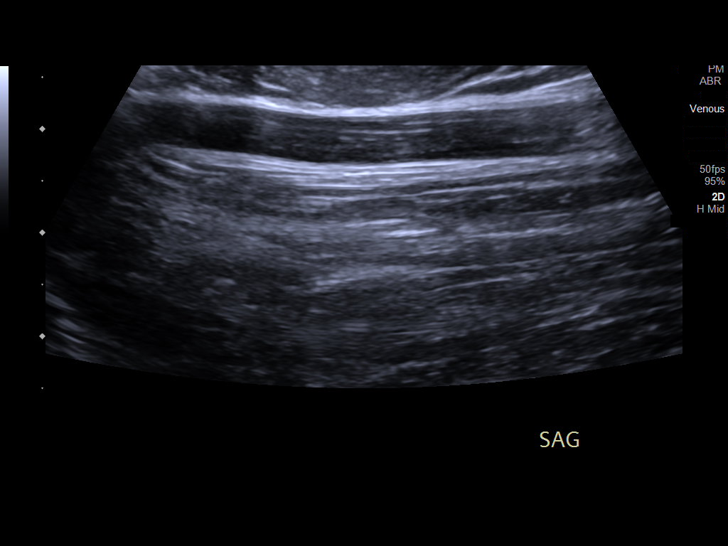

[13 of 24 positions shown; findings below may reference images not displayed]

FINDINGS: VENOUS

Normal compressibility of the LEFT common femoral, superficial
femoral, and popliteal veins, as well as the visualized calf veins.
Visualized portions of profunda femoral vein and great saphenous
vein unremarkable. No filling defects to suggest DVT on grayscale or
color Doppler imaging. Doppler waveforms show normal direction of
venous flow, normal respiratory plasticity and response to
augmentation.

Limited views of the contralateral common femoral vein are
unremarkable.

OTHER

No evidence of superficial thrombophlebitis or abnormal fluid
collection.

Focused ultrasound at the area of palpable concern, along the medial
LEFT upper thigh demonstrates a heterogeneously echogenic
subcutaneous mass measuring up to 3.5 x 0.6 x 1.7 cm. No internal
vascularity on Doppler ultrasound.

Limitations: none
IMPRESSION: 1. No evidence of femoropopliteal DVT within the LEFT lower
extremity.
2. Palpable mass at the medial LEFT upper thigh shows a 3.5 cm long
nonvascular subcutaneous lesion, likely to represent a lipoma.

## 2022-07-20 ENCOUNTER — Other Ambulatory Visit: Payer: Self-pay | Admitting: Family Medicine

## 2022-07-20 MED ORDER — AMPHETAMINE-DEXTROAMPHET ER 25 MG PO CP24: 25.0000 mg | ORAL_CAPSULE | ORAL | 0 refills | Status: DC

## 2022-08-13 ENCOUNTER — Encounter: Payer: Self-pay | Admitting: Certified Nurse Midwife

## 2022-08-14 ENCOUNTER — Ambulatory Visit (INDEPENDENT_AMBULATORY_CARE_PROVIDER_SITE_OTHER): Admitting: Certified Nurse Midwife

## 2022-08-14 ENCOUNTER — Encounter: Payer: Self-pay | Admitting: Certified Nurse Midwife

## 2022-08-14 ENCOUNTER — Other Ambulatory Visit (HOSPITAL_COMMUNITY)
Admission: RE | Admit: 2022-08-14 | Discharge: 2022-08-14 | Disposition: A | Discharge status: Home / Self Care | Source: Ambulatory Visit | Attending: Certified Nurse Midwife | Admitting: Certified Nurse Midwife

## 2022-08-14 VITALS — BP 112/75 | HR 73 | Resp 16 | Ht 67.0 in | Wt 128.9 lb

## 2022-08-14 DIAGNOSIS — Z202 Contact with and (suspected) exposure to infections with a predominantly sexual mode of transmission: Secondary | ICD-10-CM | POA: Diagnosis not present

## 2022-08-14 DIAGNOSIS — Z124 Encounter for screening for malignant neoplasm of cervix: Secondary | ICD-10-CM | POA: Diagnosis present

## 2022-08-14 DIAGNOSIS — Z01419 Encounter for gynecological examination (general) (routine) without abnormal findings: Secondary | ICD-10-CM | POA: Insufficient documentation

## 2022-08-14 MED ORDER — CEPHALEXIN 250 MG PO CAPS: 250.0000 mg | ORAL_CAPSULE | Freq: Once | ORAL | 3 refills | Status: AC

## 2022-08-14 NOTE — Progress Notes (Signed)
GYNECOLOGY ANNUAL PREVENTATIVE CARE ENCOUNTER NOTE  History:     Susan Cooper is a 31 y.o. G0P0000 female here for a routine annual gynecologic exam.  Current complaints: possible exposure STD, requesting screening.   Denies abnormal vaginal bleeding, discharge, pelvic pain, problems with intercourse or other gynecologic concerns.     Social Relationship: female partner  Living: with partner  Work: Environmental health practitioner Exercise: [employer]  Smoke/Alcohol/drug use: Occasional alcohol use , No drugs /smoking  Gynecologic History Patient's last menstrual period was 08/11/2022 (exact date). Contraception:  natural family plannie Last Pap: 08/03/2020. Results were: normal  Last mammogram: n/a .   The pregnancy intention screening data noted above was reviewed. Potential methods of contraception were discussed. The patient elected to proceed with NFP.   Obstetric History OB History  Gravida Para Term Preterm AB Living  0 0 0 0 0 0  SAB IAB Ectopic Multiple Live Births  0 0 0 0 0    Past Medical History:  Diagnosis Date   ADHD    Chronic kidney disease    Kidney stone    Urinary tract infection     Past Surgical History:  Procedure Laterality Date   WISDOM TOOTH EXTRACTION      Current Outpatient Medications on File Prior to Visit  Medication Sig Dispense Refill   amphetamine-dextroamphetamine (ADDERALL XR) 25 MG 24 hr capsule Take 1 capsule by mouth every morning. 30 capsule 0   tretinoin (RETIN-A) 0.05 % cream Apply topically at bedtime.     cephALEXin (KEFLEX) 250 MG capsule Take by mouth 4 (four) times daily.     No current facility-administered medications on file prior to visit.    No Known Allergies  Social History:  reports that she has never smoked. She has never used smokeless tobacco. She reports current alcohol use. She reports that she does not use drugs.  Family History  Problem Relation Age of Onset   Thyroid disease Mother    Hypertension Mother     Hypertension Father    Melanoma Paternal Aunt    Thyroid disease Maternal Grandmother    Hematuria Paternal Grandmother    Kidney failure Paternal Grandmother    Kidney disease Paternal Grandfather    Kidney cancer Neg Hx    Breast cancer Neg Hx    Ovarian cancer Neg Hx    Colon cancer Neg Hx     The following portions of the patient's history were reviewed and updated as appropriate: allergies, current medications, past family history, past medical history, past social history, past surgical history and problem list.  Review of Systems Pertinent items noted in HPI and remainder of comprehensive ROS otherwise negative.  Physical Exam:  BP 112/75   Pulse 73   Resp 16   Ht '5\' 7"'$  (1.702 m)   Wt 128 lb 14.4 oz (58.5 kg)   LMP 08/11/2022 (Exact Date)   BMI 20.19 kg/m  CONSTITUTIONAL: Well-developed, well-nourished female in no acute distress.  HENT:  Normocephalic, atraumatic, External right and left ear normal. Oropharynx is clear and moist EYES: Conjunctivae and EOM are normal. Pupils are equal, round, and reactive to light. No scleral icterus.  NECK: Normal range of motion, supple, no masses.  Normal thyroid.  SKIN: Skin is warm and dry. No rash noted. Not diaphoretic. No erythema. No pallor. MUSCULOSKELETAL: Normal range of motion. No tenderness.  No cyanosis, clubbing, or edema.  2+ distal pulses. NEUROLOGIC: Alert and oriented to person, place,  and time. Normal reflexes, muscle tone coordination.  PSYCHIATRIC: Normal mood and affect. Normal behavior. Normal judgment and thought content. CARDIOVASCULAR: Normal heart rate noted, regular rhythm RESPIRATORY: Clear to auscultation bilaterally. Effort and breath sounds normal, no problems with respiration noted. BREASTS: Symmetric in size. No masses, tenderness, skin changes, nipple drainage, or lymphadenopathy bilaterally.  ABDOMEN: Soft, no distention noted.  No tenderness, rebound or guarding.  PELVIC: Normal appearing external  genitalia and urethral meatus; normal appearing vaginal mucosa and cervix.  No abnormal discharge noted.  Pap smear obtained.  Normal uterine size, no other palpable masses, no uterine or adnexal tenderness.  .   Assessment and Plan:    1. Encounter for annual routine gynecological examination   Pap: Will follow up results of pap smear and manage accordingly. Mammogram : n/a  Labs: STD labs Refills: Keflex recurrent UTI postcoital  Referral: none Routine preventative health maintenance measures emphasized. Please refer to After Visit Summary for other counseling recommendations.      Philip Aspen, CNM [facility]

## 2022-08-14 NOTE — Patient Instructions (Signed)

## 2022-08-15 LAB — HEPATITIS C ANTIBODY: Hep C Virus Ab: NONREACTIVE

## 2022-08-15 LAB — HIV ANTIBODY (ROUTINE TESTING W REFLEX): HIV Screen 4th Generation wRfx: NONREACTIVE

## 2022-08-15 LAB — HSV 1 AND 2 AB, IGG
HSV 1 Glycoprotein G Ab, IgG: <0.91 index (ref 0.00–0.90)
HSV 2 IgG, Type Spec: <0.91 index (ref 0.00–0.90)

## 2022-08-15 LAB — HEPATITIS B SURFACE ANTIGEN: Hepatitis B Surface Ag: NEGATIVE

## 2022-08-20 ENCOUNTER — Other Ambulatory Visit: Payer: Self-pay | Admitting: Family Medicine

## 2022-08-21 LAB — CYTOLOGY - PAP
Chlamydia: NEGATIVE
Comment: NEGATIVE
Comment: NEGATIVE
Comment: NEGATIVE
Comment: NORMAL
Diagnosis: NEGATIVE
Diagnosis: REACTIVE
High risk HPV: NEGATIVE
Neisseria Gonorrhea: NEGATIVE
Trichomonas: NEGATIVE

## 2022-08-21 MED ORDER — AMPHETAMINE-DEXTROAMPHET ER 25 MG PO CP24: 25.0000 mg | ORAL_CAPSULE | ORAL | 0 refills | Status: DC

## 2022-09-17 ENCOUNTER — Other Ambulatory Visit: Payer: Self-pay | Admitting: Family Medicine

## 2022-09-18 MED ORDER — AMPHETAMINE-DEXTROAMPHET ER 25 MG PO CP24: 25.0000 mg | ORAL_CAPSULE | ORAL | 0 refills | Status: DC

## 2022-10-29 ENCOUNTER — Other Ambulatory Visit: Payer: Self-pay | Admitting: Family Medicine

## 2022-10-30 MED ORDER — AMPHETAMINE-DEXTROAMPHET ER 25 MG PO CP24: 25.0000 mg | ORAL_CAPSULE | ORAL | 0 refills | Status: DC

## 2022-11-04 ENCOUNTER — Encounter: Payer: Self-pay | Admitting: Family Medicine

## 2022-11-05 ENCOUNTER — Other Ambulatory Visit: Payer: Self-pay | Admitting: Family Medicine

## 2022-11-05 MED ORDER — AMPHETAMINE-DEXTROAMPHET ER 25 MG PO CP24: 25.0000 mg | ORAL_CAPSULE | ORAL | 0 refills | Status: DC

## 2022-11-05 MED ORDER — TRETINOIN 0.05 % EX CREA: TOPICAL_CREAM | Freq: Every day | CUTANEOUS | 5 refills | Status: AC

## 2022-11-08 ENCOUNTER — Other Ambulatory Visit: Payer: Self-pay | Admitting: Family Medicine

## 2022-11-08 ENCOUNTER — Encounter: Payer: Self-pay | Admitting: Family Medicine

## 2022-11-08 MED ORDER — CEPHALEXIN 250 MG PO CAPS: 250.0000 mg | ORAL_CAPSULE | Freq: Every day | ORAL | 0 refills | Status: DC | PRN

## 2022-11-20 ENCOUNTER — Ambulatory Visit: Admitting: Dermatology

## 2022-12-25 ENCOUNTER — Other Ambulatory Visit: Payer: Self-pay | Admitting: Family Medicine

## 2022-12-25 MED ORDER — CEPHALEXIN 250 MG PO CAPS: 250.0000 mg | ORAL_CAPSULE | Freq: Every day | ORAL | 0 refills | Status: DC | PRN

## 2022-12-25 MED ORDER — AMPHETAMINE-DEXTROAMPHET ER 25 MG PO CP24: 25.0000 mg | ORAL_CAPSULE | ORAL | 0 refills | Status: DC

## 2022-12-31 ENCOUNTER — Ambulatory Visit (INDEPENDENT_AMBULATORY_CARE_PROVIDER_SITE_OTHER): Admitting: Family Medicine

## 2022-12-31 VITALS — BP 118/62 | HR 75 | Temp 98.4°F | Ht 67.0 in | Wt 132.0 lb

## 2022-12-31 DIAGNOSIS — R091 Pleurisy: Secondary | ICD-10-CM | POA: Diagnosis not present

## 2022-12-31 DIAGNOSIS — N390 Urinary tract infection, site not specified: Secondary | ICD-10-CM

## 2022-12-31 NOTE — Progress Notes (Signed)
Subjective:    Patient ID: Susan Cooper, female    DOB: 1991-02-25, 32 y.o.   MRN: [id number]   Patient is a very pleasant 32 year old Caucasian female who served in the [employer].  Patient has to be cleared for employment.  She is currently on for urinary tract prophylaxis.  I have prescribed her the medication to take after sexual intercourse as this seems to be the most likely precipitating event for urinary tract infection.  However the patient has been taking the medication when she feels like she has an infection.  She will take it for 2 or 3 days and then stop the medication.  She states that she is doing this twice a month.  She saw a urologist in the past who did a renal ultrasound per her report that was normal.  She states that she has never had cystoscopy.  However the [employer] is requiring that she be cleared by urologist for deployment given the fact that she is on Keflex. She also recently had COVID.  Ever since she had COVID 2 weeks ago she has been having left-sided pleurisy over her bottom left rib.  There is no tenderness to palpation in that area.  I suspect an intercostal muscle strain.  She denies any hemoptysis or shortness of breath however it does hurt every time she takes a deep breath in. Past Medical History:  Diagnosis Date   ADHD    Chronic kidney disease    Kidney stone    Urinary tract infection    Past Surgical History:  Procedure Laterality Date   lipoma Left    left leg   WISDOM TOOTH EXTRACTION     Current Outpatient Medications on File Prior to Visit  Medication Sig Dispense Refill   amphetamine-dextroamphetamine (ADDERALL XR) 25 MG 24 hr capsule Take 1 capsule by mouth every morning. 30 capsule 0   cephALEXin (KEFLEX) 250 MG capsule Take 1 capsule (250 mg total) by mouth daily as needed. 30 capsule 0   tretinoin (RETIN-A) 0.05 % cream Apply topically at bedtime. 45 g 5   No current facility-administered medications on file prior to visit.    No Known Allergies Social History   Socioeconomic History   Marital status: Divorced    Spouse name: Not on file   Number of children: Not on file   Years of education: Not on file   Highest education level: Not on file  Occupational History   Not on file  Tobacco Use   Smoking status: Never   Smokeless tobacco: Never  Vaping Use   Vaping Use: Never used  Substance and Sexual Activity   Alcohol use: Yes    Comment: occasional    Drug use: No   Sexual activity: Yes    Birth control/protection: Other-see comments    Comment: 'pull out method'  Other Topics Concern   Not on file  Social History Narrative   Not on file   Social Determinants of Health   Financial Resource Strain: Not on file  Food Insecurity: Not on file  Transportation Needs: Not on file  Physical Activity: Not on file  Stress: Not on file  Social Connections: Not on file  Intimate Partner Violence: Not on file   Family History  Problem Relation Age of Onset   Thyroid disease Mother    Hypertension Mother    Hypertension Father    Melanoma Paternal Aunt    Thyroid disease Maternal Grandmother    Hematuria  Paternal Grandmother    Kidney failure Paternal Grandmother    Kidney disease Paternal Grandfather    Kidney cancer Neg Hx    Breast cancer Neg Hx    Ovarian cancer Neg Hx    Colon cancer Neg Hx      Review of Systems  Gastrointestinal:  Positive for abdominal pain.  All other systems reviewed and are negative.      Objective:   Physical Exam Vitals reviewed.  Constitutional:      General: She is not in acute distress.    Appearance: Normal appearance. She is normal weight. She is not ill-appearing, toxic-appearing or diaphoretic.  Cardiovascular:     Rate and Rhythm: Normal rate and regular rhythm.     Heart sounds: Normal heart sounds. No murmur heard.    No friction rub. No gallop.  Pulmonary:     Effort: Pulmonary effort is normal. No respiratory distress.     Breath  sounds: Normal breath sounds. No stridor. No wheezing, rhonchi or rales.  Chest:    Abdominal:     General: Bowel sounds are normal. There is no distension.     Palpations: Abdomen is soft.     Tenderness: There is no abdominal tenderness. There is no guarding or rebound.     Hernia: No hernia is present.  Neurological:     Mental Status: She is alert.           Assessment & Plan:  Frequent UTI - Plan: Ambulatory referral to Urology  Pleurisy - Plan: D-dimer, quantitative, CBC with Differential/Platelet, COMPLETE METABOLIC PANEL WITH GFR I believe that pleurisy is likely an intercostal muscle strain from coughing.  However given her recent COVID I will check a D-dimer and if elevated we will need to rule out pulmonary embolism.  Also check a CBC to evaluate for any leukocytosis however her lungs are completely clear to auscultation.  The patient has frequent urinary tract infections.  She reports taking Keflex up to twice a month.  The note states arm is requiring a urologist evaluation prior to deployment medical clearance.  Therefore will arrange a urology consultation to discuss possible cystoscopy

## 2023-01-01 LAB — COMPLETE METABOLIC PANEL WITH GFR
AG Ratio: 1.8 (calc) (ref 1.0–2.5)
ALT: 14 U/L (ref 6–29)
AST: 22 U/L (ref 10–30)
Albumin: 4.8 g/dL (ref 3.6–5.1)
Alkaline phosphatase (APISO): 47 U/L (ref 31–125)
BUN: 12 mg/dL (ref 7–25)
CO2: 27 mmol/L (ref 20–32)
Calcium: 9.8 mg/dL (ref 8.6–10.2)
Chloride: 104 mmol/L (ref 98–110)
Creat: 0.84 mg/dL (ref 0.50–0.97)
Globulin: 2.6 g/dL (calc) (ref 1.9–3.7)
Glucose, Bld: 98 mg/dL (ref 65–99)
Potassium: 4.2 mmol/L (ref 3.5–5.3)
Sodium: 139 mmol/L (ref 135–146)
Total Bilirubin: 0.4 mg/dL (ref 0.2–1.2)
Total Protein: 7.4 g/dL (ref 6.1–8.1)
eGFR: 95 mL/min/{1.73_m2} (ref >=60)

## 2023-01-01 LAB — CBC WITH DIFFERENTIAL/PLATELET
Absolute Monocytes: 421 cells/uL (ref 200–950)
Basophils Absolute: 78 cells/uL (ref 0–200)
Basophils Relative: 1 %
Eosinophils Absolute: 101 cells/uL (ref 15–500)
Eosinophils Relative: 1.3 %
HCT: 37.6 % (ref 35.0–45.0)
Hemoglobin: 12.3 g/dL (ref 11.7–15.5)
Lymphs Abs: 2527 cells/uL (ref 850–3900)
MCH: 29.4 pg (ref 27.0–33.0)
MCHC: 32.7 g/dL (ref 32.0–36.0)
MCV: 89.7 fL (ref 80.0–100.0)
MPV: 11.4 fL (ref 7.5–12.5)
Monocytes Relative: 5.4 %
Neutro Abs: 4672 cells/uL (ref 1500–7800)
Neutrophils Relative %: 59.9 %
Platelets: 276 10*3/uL (ref 140–400)
RBC: 4.19 10*6/uL (ref 3.80–5.10)
RDW: 11.7 % (ref 11.0–15.0)
Total Lymphocyte: 32.4 %
WBC: 7.8 10*3/uL (ref 3.8–10.8)

## 2023-01-01 LAB — D-DIMER, QUANTITATIVE: D-Dimer, Quant: <0.19 mcg/mL FEU (ref <0.50)

## 2023-01-31 ENCOUNTER — Other Ambulatory Visit: Payer: Self-pay | Admitting: Family Medicine

## 2023-01-31 MED ORDER — AMPHETAMINE-DEXTROAMPHET ER 25 MG PO CP24: 25.0000 mg | ORAL_CAPSULE | ORAL | 0 refills | Status: DC

## 2023-02-25 ENCOUNTER — Ambulatory Visit: Admitting: Urology

## 2023-02-28 ENCOUNTER — Other Ambulatory Visit: Payer: Self-pay | Admitting: Family Medicine

## 2023-03-01 ENCOUNTER — Other Ambulatory Visit: Payer: Self-pay | Admitting: Family Medicine

## 2023-03-01 MED ORDER — AMPHETAMINE-DEXTROAMPHET ER 25 MG PO CP24: 25.0000 mg | ORAL_CAPSULE | ORAL | 0 refills | Status: DC

## 2023-03-01 MED ORDER — CEPHALEXIN 250 MG PO CAPS: 250.0000 mg | ORAL_CAPSULE | Freq: Every day | ORAL | 0 refills | Status: DC | PRN

## 2023-04-04 ENCOUNTER — Other Ambulatory Visit: Payer: Self-pay | Admitting: Family Medicine

## 2023-04-08 ENCOUNTER — Encounter: Payer: Self-pay | Admitting: Family Medicine

## 2023-04-08 MED ORDER — CEPHALEXIN 250 MG PO CAPS: 250.0000 mg | ORAL_CAPSULE | Freq: Every day | ORAL | 0 refills | Status: DC | PRN

## 2023-04-08 MED ORDER — AMPHETAMINE-DEXTROAMPHET ER 25 MG PO CP24: 25.0000 mg | ORAL_CAPSULE | ORAL | 0 refills | Status: DC

## 2023-05-06 ENCOUNTER — Ambulatory Visit: Admitting: Urology

## 2023-05-06 ENCOUNTER — Encounter: Payer: Self-pay | Admitting: Urology

## 2023-05-06 ENCOUNTER — Other Ambulatory Visit: Payer: Self-pay | Admitting: Family Medicine

## 2023-05-06 VITALS — BP 116/75 | HR 76 | Ht 67.0 in | Wt 128.0 lb

## 2023-05-06 DIAGNOSIS — Z87440 Personal history of urinary (tract) infections: Secondary | ICD-10-CM

## 2023-05-06 DIAGNOSIS — N200 Calculus of kidney: Secondary | ICD-10-CM | POA: Diagnosis not present

## 2023-05-06 DIAGNOSIS — R300 Dysuria: Secondary | ICD-10-CM

## 2023-05-06 DIAGNOSIS — R3989 Other symptoms and signs involving the genitourinary system: Secondary | ICD-10-CM

## 2023-05-06 DIAGNOSIS — N390 Urinary tract infection, site not specified: Secondary | ICD-10-CM

## 2023-05-06 DIAGNOSIS — R350 Frequency of micturition: Secondary | ICD-10-CM | POA: Diagnosis not present

## 2023-05-06 LAB — URINALYSIS, COMPLETE
Bilirubin, UA: NEGATIVE
Glucose, UA: NEGATIVE
Ketones, UA: NEGATIVE
Leukocytes,UA: NEGATIVE
Nitrite, UA: NEGATIVE
Specific Gravity, UA: 1.025 (ref 1.005–1.030)
Urobilinogen, Ur: 1 mg/dL (ref 0.2–1.0)
pH, UA: 6 (ref 5.0–7.5)

## 2023-05-06 LAB — MICROSCOPIC EXAMINATION

## 2023-05-06 MED ORDER — TRIMETHOPRIM 100 MG PO TABS: 100.0000 mg | ORAL_TABLET | Freq: Every day | ORAL | 11 refills | Status: DC

## 2023-05-06 NOTE — Progress Notes (Signed)
05/06/2023 3:46 PM   Susan Cooper [birth year] [id number]  Referring provider: Donita Brooks, MD [address] [place],  [place] [address]  Chief Complaint  Patient presents with   Urinary Tract Infection    HPI: SN:   Chronic pelvic pain worse with alcohol.  Has dyspareunia.  Negative cultures.  Referred to physical therapy.  Treated with amitriptyline  Today Patient says she is getting recurrent bladder infections.  She gets typical dysuria pain and frequency that respond to an antibiotic.  She is says she has 2 small stones and is followed by another physician with a renal ultrasound.  She has been taking  after intercourse.  Initially states she gets rare breakthroughs but then she says she has to self treat herself by taking more Keflex 250 mg for cystitis breakthroughs a number of times in the last year.  It was the Keflex that she was taking after intercourse  At baseline she voids every 3 hours and gets up once a night.  Renal ultrasound performed a few years ago showed a 3 mm stone in right kidney.  Last urine culture in 2022 was negative  No neurologic issues.  No pelvic surgery.   PMH: Past Medical History:  Diagnosis Date   ADHD    Chronic kidney disease    Kidney stone    Urinary tract infection     Surgical History: Past Surgical History:  Procedure Laterality Date   lipoma Left    left leg   WISDOM TOOTH EXTRACTION      Home Medications:  Allergies as of 05/06/2023   No Known Allergies      Medication List        Accurate as of May 06, 2023  3:46 PM. If you have any questions, ask your nurse or doctor.          amphetamine-dextroamphetamine 25 MG 24 hr capsule Commonly known as: Adderall XR Take 1 capsule by mouth every morning.   cephALEXin 250 MG capsule Commonly known as: Keflex Take 1 capsule (250 mg total) by mouth daily as needed.   tretinoin 0.05 % cream Commonly known as: RETIN-A Apply topically at bedtime.         Allergies: No Known Allergies  Family History: Family History  Problem Relation Age of Onset   Thyroid disease Mother    Hypertension Mother    Hypertension Father    Melanoma Paternal Aunt    Thyroid disease Maternal Grandmother    Hematuria Paternal Grandmother    Kidney failure Paternal Grandmother    Kidney disease Paternal Grandfather    Kidney cancer Neg Hx    Breast cancer Neg Hx    Ovarian cancer Neg Hx    Colon cancer Neg Hx     Social History:  reports that she has never smoked. She has never used smokeless tobacco. She reports current alcohol use. She reports that she does not use drugs.  ROS:                                        Physical Exam: BP 116/75   Pulse 76   Ht 5\' 7"  (1.702 m)   Wt 58.1 kg   BMI 20.05 kg/m   Constitutional:  Alert and oriented, No acute distress. HEENT: [place] AT, moist mucus membranes.  Trachea midline, no masses. Cardiovascular: No clubbing, cyanosis, or  edema. Respiratory: Normal respiratory effort, no increased work of breathing. GI: Abdomen is soft, nontender, nondistended, no abdominal masses GU: Well supported bladder neck.  No prolapse or diverticulum Skin: No rashes, bruises or suspicious lesions. Lymph: No cervical or inguinal adenopathy. Neurologic: Grossly intact, no focal deficits, moving all 4 extremities. Psychiatric: Normal mood and affect.  Laboratory Data: Lab Results  Component Value Date   WBC 7.8 12/31/2022   HGB 12.3 12/31/2022   HCT 37.6 12/31/2022   MCV 89.7 12/31/2022   PLT 276 12/31/2022    Lab Results  Component Value Date   CREATININE 0.84 12/31/2022    No results found for: "PSA"  Lab Results  Component Value Date   TESTOSTERONE 18 08/03/2019    No results found for: "HGBA1C"  Urinalysis    Component Value Date/Time   COLORURINE YELLOW 05/04/2021 1613   APPEARANCEUR Clear 06/22/2021 1434   LABSPEC 1.015 05/04/2021 1613   LABSPEC 1.025 07/14/2012  1232   PHURINE 7.5 05/04/2021 1613   GLUCOSEU Negative 06/22/2021 1434   GLUCOSEU Negative 07/14/2012 1232   HGBUR TRACE (A) 05/04/2021 1613   BILIRUBINUR Negative 06/22/2021 1434   BILIRUBINUR Negative 07/14/2012 1232   KETONESUR NEGATIVE 05/04/2021 1613   PROTEINUR Negative 06/22/2021 1434   PROTEINUR NEGATIVE 05/04/2021 1613   UROBILINOGEN 0.2 08/03/2020 1452   NITRITE Negative 06/22/2021 1434   NITRITE NEGATIVE 05/04/2021 1613   LEUKOCYTESUR Trace (A) 06/22/2021 1434   LEUKOCYTESUR NEGATIVE 05/04/2021 1613   LEUKOCYTESUR Negative 07/14/2012 1232    Pertinent Imaging: Urine reviewed and sent for culture.  Chart reviewed.  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  Prophylaxis daily discussed.  Pathophysiology discussed.  She understands that she should get urine cultures on daily prophylaxis.  If they are negative she may have interstitial cystitis.  I mention to her having a inflamed bladder today but my index of suspicion is low since she tends to do very well when she treats an infection.  Repeat renal ultrasound since has not been done for a few years.  Reassess in 8 to 10 weeks on trimethoprim 100 mg 30 x 11.  1. Recurrent UTI  - Urinalysis, Complete   No follow-ups on file.  Martina Sinner, MD  [facility] [address], [address] [place], [place] [address] [phone]

## 2023-05-07 MED ORDER — AMPHETAMINE-DEXTROAMPHET ER 25 MG PO CP24: 25.0000 mg | ORAL_CAPSULE | ORAL | 0 refills | Status: DC

## 2023-05-16 ENCOUNTER — Ambulatory Visit: Admitting: Family Medicine

## 2023-06-05 ENCOUNTER — Other Ambulatory Visit: Payer: Self-pay | Admitting: Family Medicine

## 2023-06-07 MED ORDER — AMPHETAMINE-DEXTROAMPHET ER 25 MG PO CP24: 25.0000 mg | ORAL_CAPSULE | ORAL | 0 refills | Status: DC

## 2023-06-19 ENCOUNTER — Other Ambulatory Visit: Payer: Self-pay | Admitting: Family Medicine

## 2023-06-20 ENCOUNTER — Other Ambulatory Visit: Payer: Self-pay | Admitting: Family Medicine

## 2023-06-20 MED ORDER — AMPHETAMINE-DEXTROAMPHET ER 25 MG PO CP24: 25.0000 mg | ORAL_CAPSULE | ORAL | 0 refills | Status: DC

## 2023-06-25 NOTE — Telephone Encounter (Signed)
Patient called to advise pharmacy is completely out of stock of the time released ADDERALL; stated they have ADDERALL instant on hand. Patient requesting for provider to send script for it.  Pharmacy confirmed as:  [facility] [address] [place] - [address] [place] [address] Phone: [phone]  Fax: [phone] DEA #: [id number]   Please advise patient at [phone]

## 2023-06-27 ENCOUNTER — Encounter: Payer: Self-pay | Admitting: Family Medicine

## 2023-06-27 ENCOUNTER — Other Ambulatory Visit: Payer: Self-pay | Admitting: Family Medicine

## 2023-06-27 MED ORDER — AMPHETAMINE-DEXTROAMPHETAMINE 20 MG PO TABS: 20.0000 mg | ORAL_TABLET | Freq: Every day | ORAL | 0 refills | Status: DC

## 2023-07-08 ENCOUNTER — Encounter: Payer: Self-pay | Admitting: Urology

## 2023-07-08 ENCOUNTER — Ambulatory Visit (INDEPENDENT_AMBULATORY_CARE_PROVIDER_SITE_OTHER): Admitting: Urology

## 2023-07-08 VITALS — BP 110/77 | HR 83 | Ht 67.0 in | Wt 132.0 lb

## 2023-07-08 DIAGNOSIS — Z09 Encounter for follow-up examination after completed treatment for conditions other than malignant neoplasm: Secondary | ICD-10-CM | POA: Diagnosis not present

## 2023-07-08 DIAGNOSIS — Z87440 Personal history of urinary (tract) infections: Secondary | ICD-10-CM

## 2023-07-08 DIAGNOSIS — N390 Urinary tract infection, site not specified: Secondary | ICD-10-CM

## 2023-07-08 MED ORDER — CIPROFLOXACIN HCL 250 MG PO TABS: 250.0000 mg | ORAL_TABLET | Freq: Two times a day (BID) | ORAL | 0 refills | Status: AC

## 2023-07-08 NOTE — Progress Notes (Signed)
07/08/2023 1:39 PM   BEANCA KIESTER Feb 14, 1991 [id number]  Referring provider: Donita Brooks, MD [address] [place],  [place] [address]  No chief complaint on file.   HPI: SN:   Chronic pelvic pain worse with alcohol.  Has dyspareunia.  Negative cultures.  Referred to physical therapy.  Treated with amitriptyline   Today Patient says she is getting recurrent bladder infections.  She gets typical dysuria pain and frequency that respond to an antibiotic.  She is says she has 2 small stones and is followed by another physician with a renal ultrasound.  She has been taking  after intercourse.  Initially states she gets rare breakthroughs but then she says she has to self treat herself by taking more Keflex 250 mg for cystitis breakthroughs a number of times in the last year.  It was the Keflex that she was taking after intercourse   At baseline she voids every 3 hours and gets up once a night.   Renal ultrasound performed a few years ago showed a 3 mm stone in right kidney.  Last urine culture in 2022 was negative    Well supported bladder neck. 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  Prophylaxis daily discussed.  Pathophysiology discussed.  She understands that she should get urine cultures on daily prophylaxis.  If they are negative she may have interstitial cystitis.  I mention to her having a inflamed bladder today but my index of suspicion is low since she tends to do very well when she treats an infection.   Repeat renal ultrasound since has not been done for a few years.  Reassess in 8 to 10 weeks on trimethoprim 100 mg 30 x 11.  Today Frequency stable.  Ultrasound showed small stone left kidney.  Last culture negative  Was doing well on the once a day antibiotic but this morning had a little bit of mild burning and increased frequency and urine was a bit strong smelling.  She is on the once a day  antibiotic.    PMH: Past Medical History:  Diagnosis Date   ADHD    Chronic kidney disease    Kidney stone    Urinary tract infection     Surgical History: Past Surgical History:  Procedure Laterality Date   lipoma Left    left leg   WISDOM TOOTH EXTRACTION      Home Medications:  Allergies as of 07/08/2023   No Known Allergies      Medication List        Accurate as of July 08, 2023  1:39 PM. If you have any questions, ask your nurse or doctor.          amphetamine-dextroamphetamine 25 MG 24 hr capsule Commonly known as: Adderall XR Take 1 capsule by mouth every morning.   amphetamine-dextroamphetamine 20 MG tablet Commonly known as: Adderall Take 1 tablet (20 mg total) by mouth daily.   tretinoin 0.05 % cream Commonly known as: RETIN-A Apply topically at bedtime.   trimethoprim 100 MG tablet Commonly known as: TRIMPEX Take 1 tablet (100 mg total) by mouth daily.        Allergies: No Known Allergies  Family History: Family History  Problem Relation Age of Onset   Thyroid disease Mother    Hypertension Mother    Hypertension Father    Melanoma Paternal Aunt    Thyroid disease Maternal Grandmother    Hematuria Paternal Grandmother  Kidney failure Paternal Grandmother    Kidney disease Paternal Grandfather    Kidney cancer Neg Hx    Breast cancer Neg Hx    Ovarian cancer Neg Hx    Colon cancer Neg Hx     Social History:  reports that she has never smoked. She has never been exposed to tobacco smoke. She has never used smokeless tobacco. She reports current alcohol use. She reports that she does not use drugs.  ROS:                                        Physical Exam: There were no vitals taken for this visit.  Constitutional:  Alert and oriented, No acute distress. HEENT: [place] AT, moist mucus membranes.  Trachea midline, no masses.   Laboratory Data: Lab Results  Component Value Date   WBC 7.8  12/31/2022   HGB 12.3 12/31/2022   HCT 37.6 12/31/2022   MCV 89.7 12/31/2022   PLT 276 12/31/2022    Lab Results  Component Value Date   CREATININE 0.84 12/31/2022    No results found for: "PSA"  Lab Results  Component Value Date   TESTOSTERONE 18 08/03/2019    No results found for: "HGBA1C"  Urinalysis    Component Value Date/Time   COLORURINE YELLOW 05/04/2021 1613   APPEARANCEUR Clear 05/06/2023 1543   LABSPEC 1.015 05/04/2021 1613   LABSPEC 1.025 07/14/2012 1232   PHURINE 7.5 05/04/2021 1613   GLUCOSEU Negative 05/06/2023 1543   GLUCOSEU Negative 07/14/2012 1232   HGBUR TRACE (A) 05/04/2021 1613   BILIRUBINUR Negative 05/06/2023 1543   BILIRUBINUR Negative 07/14/2012 1232   KETONESUR NEGATIVE 05/04/2021 1613   PROTEINUR Trace (A) 05/06/2023 1543   PROTEINUR NEGATIVE 05/04/2021 1613   UROBILINOGEN 0.2 08/03/2020 1452   NITRITE Negative 05/06/2023 1543   NITRITE NEGATIVE 05/04/2021 1613   LEUKOCYTESUR Negative 05/06/2023 1543   LEUKOCYTESUR NEGATIVE 05/04/2021 1613   LEUKOCYTESUR Negative 07/14/2012 1232    Pertinent Imaging: Urine reviewed and sent for culture  Assessment & Plan: This may or may not be a breakthrough infection.  Patient understands if it is a breakthrough I will treat it and switch her to a different once a day antibiotic.  If the culture is negative I would like to follow her a little bit longer on the once a day and keep getting cultures.  She may need a hydrodistention in the future.  Her symptoms were milder today and may subside spontaneously  I called in ciprofloxacin 250 mg twice a day for 7 days because the urine did look positive.  She agreed with the plan.  I have not gone over a hydrodistention in detail yet with her.  She will go back on the once a day after the Cipro.  Call if culture differs.  Proceed accordingly reassess in 6 weeks  There are no diagnoses linked to this encounter.  No follow-ups on file.  Martina Sinner,  MD  [facility] [address] [place], [place] [address] [phone]

## 2023-07-09 LAB — MICROSCOPIC EXAMINATION: WBC, UA: >30 /[HPF] — AB (ref 0–5)

## 2023-07-09 LAB — URINALYSIS, COMPLETE
Bilirubin, UA: NEGATIVE
Glucose, UA: NEGATIVE
Ketones, UA: NEGATIVE
Nitrite, UA: NEGATIVE
Protein,UA: NEGATIVE
Specific Gravity, UA: 1.01 (ref 1.005–1.030)
Urobilinogen, Ur: 1 mg/dL (ref 0.2–1.0)
pH, UA: 6.5 (ref 5.0–7.5)

## 2023-07-11 LAB — CULTURE, URINE COMPREHENSIVE

## 2023-07-17 ENCOUNTER — Ambulatory Visit: Admitting: Family Medicine

## 2023-07-23 ENCOUNTER — Ambulatory Visit: Admitting: Family Medicine

## 2023-07-25 ENCOUNTER — Ambulatory Visit: Admitting: Family Medicine

## 2023-07-29 ENCOUNTER — Other Ambulatory Visit: Payer: Self-pay | Admitting: Family Medicine

## 2023-08-01 ENCOUNTER — Telehealth: Payer: Self-pay

## 2023-08-01 ENCOUNTER — Other Ambulatory Visit: Payer: Self-pay | Admitting: Family Medicine

## 2023-08-01 MED ORDER — AMPHETAMINE-DEXTROAMPHETAMINE 20 MG PO TABS: 20.0000 mg | ORAL_TABLET | Freq: Every day | ORAL | 0 refills | Status: DC

## 2023-08-01 NOTE — Telephone Encounter (Signed)
Pt called in to request a refill of this med amphetamine-dextroamphetamine (ADDERALL) 20 MG tablet.    LOV: 12/31/22  PHARMACY: [id number], [place] - [address], [place] [address] Phone: [phone]  Fax: [phone] DEA #: [id number]  CB#: [phone]

## 2023-08-19 ENCOUNTER — Ambulatory Visit: Admitting: Urology

## 2023-08-28 NOTE — Progress Notes (Deleted)
       Subjective:   Susan Cooper is a 32 y.o. G0P0000 {Race/ethnicity:17218} female here for a routine well-woman exam.  No LMP recorded.    Current complaints: *** PCP: ***       *** desire labs  Social History: Sexual: {sexual:30375} Marital Status: {MARITAL STATUS:62} Living situation: {LIVING ARRANGEMENTS:60532} Occupation: {occupation:30378} Tobacco/alcohol: {tobacco/alcohol/caffeine:30392} Illicit drugs: {illicit drugs:30398}  The following portions of the patient's history were reviewed and updated as appropriate: allergies, current medications, past family history, past medical history, past social history, past surgical history and problem list.  Past Medical History Past Medical History:  Diagnosis Date   ADHD    Chronic kidney disease    Kidney stone    Urinary tract infection     Past Surgical History Past Surgical History:  Procedure Laterality Date   lipoma Left    left leg   WISDOM TOOTH EXTRACTION      Gynecologic History G0P0000  No LMP recorded. Contraception: {method:5051} Last Pap: 08/14/2022. Results were: normal Last mammogram: N/A.    Obstetric History OB History  Gravida Para Term Preterm AB Living  0 0 0 0 0 0  SAB IAB Ectopic Multiple Live Births  0 0 0 0 0    Current Medications Current Outpatient Medications on File Prior to Visit  Medication Sig Dispense Refill   amphetamine-dextroamphetamine (ADDERALL) 20 MG tablet Take 1 tablet (20 mg total) by mouth daily. 30 tablet 0   tretinoin (RETIN-A) 0.05 % cream Apply topically at bedtime. 45 g 5   trimethoprim (TRIMPEX) 100 MG tablet Take 1 tablet (100 mg total) by mouth daily. 30 tablet 11   No current facility-administered medications on file prior to visit.    Review of Systems Patient denies any headaches, blurred vision, shortness of breath, chest pain, abdominal pain, problems with bowel movements, urination, or intercourse.  Objective:  There were no vitals taken for  this visit. Physical Exam  General:  Well developed, well nourished, no acute distress. She is alert and oriented x3. Skin:  Warm and dry Neck:  Midline trachea, no thyromegaly or nodules Cardiovascular: Regular rate and rhythm, no murmur heard Lungs:  Effort normal, all lung fields clear to auscultation bilaterally Breasts:  No dominant palpable mass, retraction, or nipple discharge Abdomen:  Soft, non tender, no hepatosplenomegaly or masses Pelvic:  External genitalia is normal in appearance.  The vagina is normal in appearance. The cervix is bulbous, no CMT.  Thin prep pap is {Desc; done/not:10129} *** HR HPV cotesting. Uterus is felt to be normal size, shape, and contour.  No adnexal masses or tenderness noted. Rectal: Good sphincter tone, no polyps, or hemorrhoids felt.  Hemoccult: *** Extremities:  No swelling or varicosities noted Psych:  She has a normal mood and affect  Assessment:   Healthy well-woman exam  Plan:  *** F/U *** for ***, or sooner if needed Mammogram *** or sooner if problems Colonoscopy *** or sooner if problems

## 2023-08-29 ENCOUNTER — Ambulatory Visit: Admitting: Certified Nurse Midwife

## 2023-08-30 ENCOUNTER — Other Ambulatory Visit: Payer: Self-pay | Admitting: Family Medicine

## 2023-09-02 ENCOUNTER — Ambulatory Visit: Admitting: Certified Nurse Midwife

## 2023-09-04 ENCOUNTER — Telehealth: Payer: Self-pay

## 2023-09-04 NOTE — Telephone Encounter (Signed)
Copied from CRM [id number]. Topic: Clinical - Medication Refill >> Sep 04, 2023  9:08 AM Prudencio Pair wrote: Most Recent Primary Care Visit:  Provider: Lynnea Ferrier T  Department: [facility]  Visit Type: OFFICE VISIT  Date: 12/31/2022  Medication: Adderall 20mg  Has the patient contacted their pharmacy? Yes, states they haven't received anything.  (Agent: If no, request that the patient contact the pharmacy for the refill. If patient does not wish to contact the pharmacy document the reason why and proceed with request.) (Agent: If yes, when and what did the pharmacy advise?)  Is this the correct pharmacy for this prescription? Yes If no, delete pharmacy and type the correct one.  This is the patient's preferred pharmacy:  [phone] [place] - [address] [place] [address] Phone: [phone] Fax: [phone]   Has the prescription been filled recently? Yes  Is the patient out of the medication? Yes  Has the patient been seen for an appointment in the last year OR does the patient have an upcoming appointment? Yes  Can we respond through MyChart? Yes  Agent: Please be advised that Rx refills may take up to 3 business days. We ask that you follow-up with your pharmacy.

## 2023-09-05 MED ORDER — AMPHETAMINE-DEXTROAMPHETAMINE 20 MG PO TABS: 20.0000 mg | ORAL_TABLET | Freq: Every day | ORAL | 0 refills | Status: DC

## 2023-09-16 MED ORDER — AMPHETAMINE-DEXTROAMPHETAMINE 20 MG PO TABS: 20.0000 mg | ORAL_TABLET | Freq: Every day | ORAL | 0 refills | Status: DC

## 2023-09-16 NOTE — Progress Notes (Unsigned)
GYNECOLOGY ANNUAL PREVENTATIVE CARE ENCOUNTER NOTE  History:     Susan Cooper is a 32 y.o. G0P0000 female here for a routine annual gynecologic exam.  Current complaints: ***.   Denies abnormal vaginal bleeding, discharge, pelvic pain, problems with intercourse or other gynecologic concerns.     Social Relationship: Living: Work: Exercise: Smoke/Alcohol/drug use:  Gynecologic History No LMP recorded. Contraception: {method:5051} Last Pap: 08/14/2022. Results were: normal with negative HPV   Obstetric History OB History  Gravida Para Term Preterm AB Living  0 0 0 0 0 0  SAB IAB Ectopic Multiple Live Births  0 0 0 0 0    Past Medical History:  Diagnosis Date   ADHD    Chronic kidney disease    Kidney stone    Urinary tract infection     Past Surgical History:  Procedure Laterality Date   lipoma Left    left leg   WISDOM TOOTH EXTRACTION      Current Outpatient Medications on File Prior to Visit  Medication Sig Dispense Refill   amphetamine-dextroamphetamine (ADDERALL) 20 MG tablet Take 1 tablet (20 mg total) by mouth daily. 30 tablet 0   tretinoin (RETIN-A) 0.05 % cream Apply topically at bedtime. 45 g 5   trimethoprim (TRIMPEX) 100 MG tablet Take 1 tablet (100 mg total) by mouth daily. 30 tablet 11   No current facility-administered medications on file prior to visit.    No Known Allergies  Social History:  reports that she has never smoked. She has never been exposed to tobacco smoke. She has never used smokeless tobacco. She reports current alcohol use. She reports that she does not use drugs.  Family History  Problem Relation Age of Onset   Thyroid disease Mother    Hypertension Mother    Hypertension Father    Melanoma Paternal Aunt    Thyroid disease Maternal Grandmother    Hematuria Paternal Grandmother    Kidney failure Paternal Grandmother    Kidney disease Paternal Grandfather    Kidney cancer Neg Hx    Breast cancer Neg Hx     Ovarian cancer Neg Hx    Colon cancer Neg Hx     The following portions of the patient's history were reviewed and updated as appropriate: allergies, current medications, past family history, past medical history, past social history, past surgical history and problem list.  Review of Systems Pertinent items noted in HPI and remainder of comprehensive ROS otherwise negative.  Physical Exam:  There were no vitals taken for this visit. CONSTITUTIONAL: Well-developed, well-nourished female in no acute distress.  HENT:  Normocephalic, atraumatic, External right and left ear normal. Oropharynx is clear and moist EYES: Conjunctivae and EOM are normal. Pupils are equal, round, and reactive to light. No scleral icterus.  NECK: Normal range of motion, supple, no masses.  Normal thyroid.  SKIN: Skin is warm and dry. No rash noted. Not diaphoretic. No erythema. No pallor. MUSCULOSKELETAL: Normal range of motion. No tenderness.  No cyanosis, clubbing, or edema.  2+ distal pulses. NEUROLOGIC: Alert and oriented to person, place, and time. Normal reflexes, muscle tone coordination.  PSYCHIATRIC: Normal mood and affect. Normal behavior. Normal judgment and thought content. CARDIOVASCULAR: Normal heart rate noted, regular rhythm RESPIRATORY: Clear to auscultation bilaterally. Effort and breath sounds normal, no problems with respiration noted. BREASTS: Symmetric in size. No masses, tenderness, skin changes, nipple drainage, or lymphadenopathy bilaterally.  ABDOMEN: Soft, no distention noted.  No tenderness,  rebound or guarding.  PELVIC: Normal appearing external genitalia and urethral meatus; normal appearing vaginal mucosa and cervix.  No abnormal discharge noted.  Pap smear obtained.  Normal uterine size, no other palpable masses, no uterine or adnexal tenderness.  .   Assessment and Plan:    There are no diagnoses linked to this encounter. Will follow up results of pap smear and manage  accordingly. Pap: Mammogram : Labs: Refills: Referral: Routine preventative health maintenance measures emphasized. Please refer to After Visit Summary for other counseling recommendations.      Doreene Burke, CNM [facility]  [facility],  [facility]

## 2023-09-17 ENCOUNTER — Encounter: Payer: Self-pay | Admitting: Certified Nurse Midwife

## 2023-09-17 ENCOUNTER — Other Ambulatory Visit (HOSPITAL_COMMUNITY)
Admission: RE | Admit: 2023-09-17 | Discharge: 2023-09-17 | Disposition: A | Discharge status: Home / Self Care | Source: Ambulatory Visit | Attending: Certified Nurse Midwife | Admitting: Certified Nurse Midwife

## 2023-09-17 ENCOUNTER — Ambulatory Visit (INDEPENDENT_AMBULATORY_CARE_PROVIDER_SITE_OTHER): Admitting: Certified Nurse Midwife

## 2023-09-17 ENCOUNTER — Ambulatory Visit: Admitting: Certified Nurse Midwife

## 2023-09-17 VITALS — BP 110/69 | HR 94 | Ht 66.5 in | Wt 130.6 lb

## 2023-09-17 DIAGNOSIS — Z124 Encounter for screening for malignant neoplasm of cervix: Secondary | ICD-10-CM

## 2023-09-17 DIAGNOSIS — Z01419 Encounter for gynecological examination (general) (routine) without abnormal findings: Secondary | ICD-10-CM

## 2023-09-17 NOTE — Progress Notes (Signed)
GYNECOLOGY ANNUAL PREVENTATIVE CARE ENCOUNTER NOTE  History:     Susan Cooper is a 32 y.o. G0P0000 female here for a routine annual gynecologic exam.  Current complaints: none.   Denies abnormal vaginal bleeding, discharge, pelvic pain, problems with intercourse or other gynecologic concerns.     Social Relationship:female partner Living: with partner  Work: [employer] Exercise: [employer]  Smoke/Alcohol/drug use: occasional alcohol use , denies drugs, smoking, vaping.   Gynecologic History Patient's last menstrual period was 09/03/2023 (exact date). Contraception:  natural family planning  Last Pap: 08/14/2022. Results were: normal with negative HPV   Obstetric History OB History  Gravida Para Term Preterm AB Living  0 0 0 0 0 0  SAB IAB Ectopic Multiple Live Births  0 0 0 0 0    Past Medical History:  Diagnosis Date   ADHD    Chronic kidney disease    Kidney stone    Urinary tract infection     Past Surgical History:  Procedure Laterality Date   lipoma Left    left leg   WISDOM TOOTH EXTRACTION      Current Outpatient Medications on File Prior to Visit  Medication Sig Dispense Refill   amphetamine-dextroamphetamine (ADDERALL) 20 MG tablet Take 1 tablet (20 mg total) by mouth daily. 30 tablet 0   amphetamine-dextroamphetamine (ADDERALL) 20 MG tablet Take 1 tablet (20 mg total) by mouth daily. 30 tablet 0   tretinoin (RETIN-A) 0.05 % cream Apply topically at bedtime. 45 g 5   trimethoprim (TRIMPEX) 100 MG tablet Take 1 tablet (100 mg total) by mouth daily. 30 tablet 11   No current facility-administered medications on file prior to visit.    No Known Allergies  Social History:  reports that she has never smoked. She has never been exposed to tobacco smoke. She has never used smokeless tobacco. She reports current alcohol use. She reports that she does not use drugs.  Family History  Problem Relation Age of Onset   Thyroid disease Mother    Hypertension  Mother    Hypertension Father    Melanoma Paternal Aunt    Thyroid disease Maternal Grandmother    Hematuria Paternal Grandmother    Kidney failure Paternal Grandmother    Kidney disease Paternal Grandfather    Kidney cancer Neg Hx    Breast cancer Neg Hx    Ovarian cancer Neg Hx    Colon cancer Neg Hx     The following portions of the patient's history were reviewed and updated as appropriate: allergies, current medications, past family history, past medical history, past social history, past surgical history and problem list.  Review of Systems Pertinent items noted in HPI and remainder of comprehensive ROS otherwise negative.  Physical Exam:  BP 110/69   Pulse 94   Ht 5' 6.5" (1.689 m)   Wt 130 lb 9.6 oz (59.2 kg)   LMP 09/03/2023 (Exact Date)   BMI 20.76 kg/m  CONSTITUTIONAL: Well-developed, well-nourished female in no acute distress.  HENT:  Normocephalic, atraumatic, External right and left ear normal. Oropharynx is clear and moist EYES: Conjunctivae and EOM are normal. Pupils are equal, round, and reactive to light. No scleral icterus.  NECK: Normal range of motion, supple, no masses.  Normal thyroid.  SKIN: Skin is warm and dry. No rash noted. Not diaphoretic. No erythema. No pallor. MUSCULOSKELETAL: Normal range of motion. No tenderness.  No cyanosis, clubbing, or edema.  2+ distal pulses. NEUROLOGIC:  Alert and oriented to person, place, and time. Normal reflexes, muscle tone coordination.  PSYCHIATRIC: Normal mood and affect. Normal behavior. Normal judgment and thought content. CARDIOVASCULAR: Normal heart rate noted, regular rhythm RESPIRATORY: Clear to auscultation bilaterally. Effort and breath sounds normal, no problems with respiration noted. BREASTS: Symmetric in size. No masses, tenderness, skin changes, nipple drainage, or lymphadenopathy bilaterally.  ABDOMEN: Soft, no distention noted.  No tenderness, rebound or guarding.  PELVIC: Normal appearing external  genitalia and urethral meatus; normal appearing vaginal mucosa and cervix.  No abnormal discharge noted.  Pap smear obtained.  Normal uterine size, no other palpable masses, no uterine or adnexal tenderness.  .   Assessment and Plan:  Annual [facility] Exam .  Pap: Will follow up results of pap smear and manage accordingly. Mammogram : n/a  Labs: none  Refills: none Referral: none  Routine preventative health maintenance measures emphasized. Please refer to After Visit Summary for other counseling recommendations.      Doreene Burke, CNM [facility]  [facility],  [facility]

## 2023-09-17 NOTE — Progress Notes (Signed)
GYNECOLOGY ANNUAL PREVENTATIVE CARE ENCOUNTER NOTE  History:     Susan Cooper is a 32 y.o. G0P0000 female here for a routine annual gynecologic exam.  Current complaints: none.   Denies abnormal vaginal bleeding, discharge, pelvic pain, problems with intercourse or other gynecologic concerns.     Social Relationship:in a relationship Living: with partner Work:full time Exercise:5-6x a week Smoke/Alcohol/drug ZOX:WRUEAV tobacco use/ occasional alcohol use/ denies drug use  Gynecologic History Patient's last menstrual period was 09/03/2023 (exact date). Contraception: condoms Last Pap: 08/14/2022. Results were: normal with negative HPV   Obstetric History OB History  Gravida Para Term Preterm AB Living  0 0 0 0 0 0  SAB IAB Ectopic Multiple Live Births  0 0 0 0 0    Past Medical History:  Diagnosis Date   ADHD    Chronic kidney disease    Kidney stone    Urinary tract infection     Past Surgical History:  Procedure Laterality Date   lipoma Left    left leg   WISDOM TOOTH EXTRACTION      Current Outpatient Medications on File Prior to Visit  Medication Sig Dispense Refill   amphetamine-dextroamphetamine (ADDERALL) 20 MG tablet Take 1 tablet (20 mg total) by mouth daily. 30 tablet 0   amphetamine-dextroamphetamine (ADDERALL) 20 MG tablet Take 1 tablet (20 mg total) by mouth daily. 30 tablet 0   tretinoin (RETIN-A) 0.05 % cream Apply topically at bedtime. 45 g 5   trimethoprim (TRIMPEX) 100 MG tablet Take 1 tablet (100 mg total) by mouth daily. 30 tablet 11   No current facility-administered medications on file prior to visit.    No Known Allergies  Social History:  reports that she has never smoked. She has never been exposed to tobacco smoke. She has never used smokeless tobacco. She reports current alcohol use. She reports that she does not use drugs.  Family History  Problem Relation Age of Onset   Thyroid disease Mother    Hypertension Mother     Hypertension Father    Melanoma Paternal Aunt    Thyroid disease Maternal Grandmother    Hematuria Paternal Grandmother    Kidney failure Paternal Grandmother    Kidney disease Paternal Grandfather    Kidney cancer Neg Hx    Breast cancer Neg Hx    Ovarian cancer Neg Hx    Colon cancer Neg Hx     The following portions of the patient's history were reviewed and updated as appropriate: allergies, current medications, past family history, past medical history, past social history, past surgical history and problem list.  Review of Systems Pertinent items noted in HPI and remainder of comprehensive ROS otherwise negative.  Physical Exam:  BP 110/69   Pulse 94   Ht 5' 6.5" (1.689 m)   Wt 130 lb 9.6 oz (59.2 kg)   LMP 09/03/2023 (Exact Date)   BMI 20.76 kg/m  CONSTITUTIONAL: Well-developed, well-nourished female in no acute distress.  HENT:  Normocephalic, atraumatic, External right and left ear normal. Oropharynx is clear and moist EYES: Conjunctivae and EOM are normal. Pupils are equal, round, and reactive to light. No scleral icterus.  NECK: Normal range of motion, supple, no masses.  Normal thyroid.  SKIN: Skin is warm and dry. No rash noted. Not diaphoretic. No erythema. No pallor. MUSCULOSKELETAL: Normal range of motion. No tenderness.  No cyanosis, clubbing, or edema.  2+ distal pulses. NEUROLOGIC: Alert and oriented to person, place,  and time. Normal reflexes, muscle tone coordination.  PSYCHIATRIC: Normal mood and affect. Normal behavior. Normal judgment and thought content. CARDIOVASCULAR: Normal heart rate noted, regular rhythm RESPIRATORY: Clear to auscultation bilaterally. Effort and breath sounds normal, no problems with respiration noted. BREASTS: Symmetric in size. No masses, tenderness, skin changes, nipple drainage, or lymphadenopathy bilaterally.  ABDOMEN: Soft, no distention noted.  No tenderness, rebound or guarding.  PELVIC: Normal appearing external genitalia  and urethral meatus; normal appearing vaginal mucosa and cervix.  No abnormal discharge noted.  Pap smear obtained.  Normal uterine size, no other palpable masses, no uterine or adnexal tenderness.  .   Assessment and Plan:    There are no diagnoses linked to this encounter. Will follow up results of pap smear and manage accordingly. Pap: Mammogram : Labs: Refills: Referral: Routine preventative health maintenance measures emphasized. Please refer to After Visit Summary for other counseling recommendations.      Doreene Burke, CNM  [facility]  [facility],  [facility]

## 2023-09-17 NOTE — Patient Instructions (Signed)

## 2023-09-20 LAB — CYTOLOGY - PAP
Comment: NEGATIVE
Diagnosis: NEGATIVE
High risk HPV: NEGATIVE

## 2023-10-07 ENCOUNTER — Ambulatory Visit: Admitting: Urology

## 2023-10-08 ENCOUNTER — Encounter: Payer: Self-pay | Admitting: Urology

## 2023-10-15 ENCOUNTER — Ambulatory Visit (INDEPENDENT_AMBULATORY_CARE_PROVIDER_SITE_OTHER): Admitting: Family Medicine

## 2023-10-15 ENCOUNTER — Encounter: Payer: Self-pay | Admitting: Family Medicine

## 2023-10-15 ENCOUNTER — Encounter

## 2023-10-15 VITALS — BP 116/70 | HR 66 | Temp 98.2°F | Ht 66.5 in | Wt 135.5 lb

## 2023-10-15 DIAGNOSIS — H10501 Unspecified blepharoconjunctivitis, right eye: Secondary | ICD-10-CM

## 2023-10-15 DIAGNOSIS — J40 Bronchitis, not specified as acute or chronic: Secondary | ICD-10-CM

## 2023-10-15 DIAGNOSIS — H109 Unspecified conjunctivitis: Secondary | ICD-10-CM | POA: Insufficient documentation

## 2023-10-15 MED ORDER — BENZONATATE 100 MG PO CAPS: 100.0000 mg | ORAL_CAPSULE | Freq: Three times a day (TID) | ORAL | 0 refills | Status: DC | PRN

## 2023-10-15 MED ORDER — AZITHROMYCIN 250 MG PO TABS: ORAL_TABLET | ORAL | 0 refills | Status: AC

## 2023-10-15 MED ORDER — ALBUTEROL SULFATE HFA 108 (90 BASE) MCG/ACT IN AERS: 2.0000 | INHALATION_SPRAY | Freq: Four times a day (QID) | RESPIRATORY_TRACT | 0 refills | Status: DC | PRN

## 2023-10-15 MED ORDER — POLYMYXIN B-TRIMETHOPRIM 10000-0.1 UNIT/ML-% OP SOLN: 2.0000 [drp] | OPHTHALMIC | 0 refills | Status: DC

## 2023-10-15 NOTE — Progress Notes (Signed)
 Acute Office Visit  Subjective:     Patient ID: Susan Cooper, female    DOB: September 25, 1991, 33 y.o.   MRN: [id number]  Chief Complaint  Patient presents with   Conjunctivitis    Noticed pink eye this morning.    Nasal Congestion    X 1 week. Productive cough.    HPI Patient is in today for right pink eye and cough/congestion for one week.  1-conjunctivitis- pt noticed pink eye today, right eye has been goopy and red, itchy, and stinging. Pt is in [employer] and concerned about having this and spreading it to coworkers.  Pt has upcoming PT test. Has Used warm wash cloth which helps. Used OTC eye drops for red eye but not helping.  Pt normally wears contact lenses but today wearing glasses. Pt has had ill contacts, BF's kids have been sick.Pt has had subjective fever/chills but thermometer was not functioning properly today.  2-Possible sinusitis/bronchitis. Pt has had congestion and nasal purulence for the past week. Has tried OTC Robitussin, Daquil and Niquil Pt having Productive cough,wheezing and SOB and thinks symptoms are getting worse.  Noticed bloody purulence from right nostril. No hx of allergies. Nonsmoker and not exposed to tobacco Has PT test coming up so has been working out consistently and pushing through it.  ROS      Objective:    BP 116/70 (BP Location: Left Arm)   Pulse 66   Temp 98.2 F (36.8 C)   Ht 5' 6.5 (1.689 m)   Wt 135 lb 8 oz (61.5 kg)   LMP 09/03/2023 (Exact Date)   SpO2 99%   BMI 21.54 kg/m     Physical Exam Vitals and nursing note reviewed.  Constitutional:      Appearance: Normal appearance.  HENT:     Head: Normocephalic and atraumatic.     Right Ear: Tympanic membrane and ear canal normal.     Left Ear: Tympanic membrane and ear canal normal.     Nose: Congestion present.     Comments: Right turbinates edematous/erythematous. Left nares normal     Mouth/Throat:     Pharynx: Oropharynx is clear. No oropharyngeal exudate or posterior  oropharyngeal erythema.     Comments: Postnasal drip Eyes:     General:        Right eye: Discharge present.        Left eye: No discharge.  Cardiovascular:     Rate and Rhythm: Normal rate and regular rhythm.  Pulmonary:     Effort: Pulmonary effort is normal.     Breath sounds: Normal breath sounds. No wheezing.  Lymphadenopathy:     Cervical: No cervical adenopathy.  Skin:    General: Skin is warm and dry.  Neurological:     General: No focal deficit present.     Mental Status: She is alert and oriented to person, place, and time.     No results found for any visits on 10/15/23.      Assessment & Plan:   Problem List Items Addressed This Visit       Respiratory   Bronchitis with wheezing - Primary     Other   Conjunctivitis    Meds ordered this encounter  Medications   albuterol  (VENTOLIN  HFA) 108 (90 Base) MCG/ACT inhaler    Sig: Inhale 2 puffs into the lungs every 6 (six) hours as needed for wheezing or shortness of breath.    Dispense:  1 each    Refill:  0   benzonatate  (TESSALON  PERLES) 100 MG capsule    Sig: Take 1 capsule (100 mg total) by mouth 3 (three) times daily as needed for cough.    Dispense:  20 capsule    Refill:  0   azithromycin  (ZITHROMAX ) 250 MG tablet    Sig: Take 2 tablets on day 1, then 1 tablet daily on days 2 through 5    Dispense:  6 tablet    Refill:  0   trimethoprim -polymyxin b  (POLYTRIM ) ophthalmic solution    Sig: Place 2 drops into the right eye every 4 (four) hours.    Dispense:  10 mL    Refill:  0    No follow-ups on file.  Connie Emperor, MD

## 2023-11-04 ENCOUNTER — Other Ambulatory Visit: Payer: Self-pay | Admitting: Family Medicine

## 2023-11-04 MED ORDER — AMPHETAMINE-DEXTROAMPHETAMINE 20 MG PO TABS: 20.0000 mg | ORAL_TABLET | Freq: Every day | ORAL | 0 refills | Status: DC

## 2023-11-06 ENCOUNTER — Other Ambulatory Visit: Payer: Self-pay | Admitting: Family Medicine

## 2023-11-15 ENCOUNTER — Ambulatory Visit: Admitting: Family Medicine

## 2023-11-18 ENCOUNTER — Ambulatory Visit: Admitting: Family Medicine

## 2023-12-05 ENCOUNTER — Other Ambulatory Visit: Payer: Self-pay | Admitting: Family Medicine

## 2023-12-06 ENCOUNTER — Ambulatory Visit: Admitting: Family Medicine

## 2023-12-09 ENCOUNTER — Encounter: Payer: Self-pay | Admitting: Family Medicine

## 2023-12-09 ENCOUNTER — Ambulatory Visit (INDEPENDENT_AMBULATORY_CARE_PROVIDER_SITE_OTHER): Admitting: Family Medicine

## 2023-12-09 ENCOUNTER — Other Ambulatory Visit: Payer: Self-pay

## 2023-12-09 VITALS — BP 120/82 | HR 99 | Temp 97.6°F | Ht 66.5 in | Wt 137.0 lb

## 2023-12-09 DIAGNOSIS — F900 Attention-deficit hyperactivity disorder, predominantly inattentive type: Secondary | ICD-10-CM

## 2023-12-09 DIAGNOSIS — R5383 Other fatigue: Secondary | ICD-10-CM | POA: Diagnosis not present

## 2023-12-09 DIAGNOSIS — M545 Low back pain, unspecified: Secondary | ICD-10-CM

## 2023-12-09 DIAGNOSIS — Z13220 Encounter for screening for lipoid disorders: Secondary | ICD-10-CM

## 2023-12-09 MED ORDER — AMPHETAMINE-DEXTROAMPHETAMINE 20 MG PO TABS: 20.0000 mg | ORAL_TABLET | Freq: Every day | ORAL | 0 refills | Status: DC

## 2023-12-09 NOTE — Progress Notes (Signed)
 Subjective:    Patient ID: Susan Cooper, female    DOB: 1991-07-15, 33 y.o.   MRN: [id number]  Patient states for the last 6 months, she is having a sharp pain in her lower back around the level of L5 when she is standing.  It radiates in her back horizontally like a belt.  She can stand for 1 or 2 hours with the pain starts to happen.  Sometimes will even happen after sitting for a while.  She denies any radiation of the pain down her legs.  She denies any leg weakness.  She denies any saddle anesthesia or bowel or bladder incontinence.  She has no scoliosis.  She denies any falls or injuries.  She also reports fatigue.  She reports heavy periods that can be erratic however she wants to avoid birth control.  She denies any chest pain or shortness of breath.  She is also requesting a refill on her ADHD medications.  They are currently working at the present dose. Past Medical History:  Diagnosis Date   ADHD    Chronic kidney disease    Kidney stone    Urinary tract infection    Past Surgical History:  Procedure Laterality Date   lipoma Left    left leg   WISDOM TOOTH EXTRACTION     Current Outpatient Medications on File Prior to Visit  Medication Sig Dispense Refill   albuterol (VENTOLIN HFA) 108 (90 Base) MCG/ACT inhaler TAKE 2 PUFFS BY MOUTH EVERY 6 HOURS AS NEEDED FOR WHEEZE OR SHORTNESS OF BREATH 8.5 each 0   amphetamine-dextroamphetamine (ADDERALL) 20 MG tablet Take 1 tablet (20 mg total) by mouth daily. 30 tablet 0   amphetamine-dextroamphetamine (ADDERALL) 20 MG tablet Take 1 tablet (20 mg total) by mouth daily. 30 tablet 0   benzonatate (TESSALON PERLES) 100 MG capsule Take 1 capsule (100 mg total) by mouth 3 (three) times daily as needed for cough. 20 capsule 0   spironolactone (ALDACTONE) 25 MG tablet Take 25 mg by mouth daily.     tretinoin (RETIN-A) 0.05 % cream Apply topically at bedtime. 45 g 5   trimethoprim (TRIMPEX) 100 MG tablet Take 1 tablet (100 mg total) by mouth  daily. 30 tablet 11   trimethoprim-polymyxin b (POLYTRIM) ophthalmic solution Place 2 drops into the right eye every 4 (four) hours. 10 mL 0   No current facility-administered medications on file prior to visit.   No Known Allergies Social History   Socioeconomic History   Marital status: Divorced    Spouse name: Not on file   Number of children: Not on file   Years of education: Not on file   Highest education level: Bachelor's degree (e.g., BA, AB, BS)  Occupational History   Not on file  Tobacco Use   Smoking status: Never    Passive exposure: Never   Smokeless tobacco: Never  Vaping Use   Vaping status: Never Used  Substance and Sexual Activity   Alcohol use: Yes    Comment: occasional    Drug use: No   Sexual activity: Yes    Birth control/protection: Other-see comments    Comment: 'pull out method'  Other Topics Concern   Not on file  Social History Narrative   Not on file   Social Drivers of Health   Financial Resource Strain: Low Risk  (11/11/2023)   Overall Financial Resource Strain (CARDIA)    Difficulty of Paying Living Expenses: Not hard at all  Food Insecurity: No  Food Insecurity (11/11/2023)   Hunger Vital Sign    Worried About Running Out of Food in the Last Year: Never true    Ran Out of Food in the Last Year: Never true  Transportation Needs: No Transportation Needs (11/11/2023)   PRAPARE - Administrator, Civil Service (Medical): No    Lack of Transportation (Non-Medical): No  Physical Activity: Sufficiently Active (11/11/2023)   Exercise Vital Sign    Days of Exercise per Week: 6 days    Minutes of Exercise per Session: 70 min  Stress: Stress Concern Present (11/11/2023)   [employer] of Occupational Health - Occupational Stress Questionnaire    Feeling of Stress : To some extent  Social Connections: Moderately Isolated (11/11/2023)   Social Connection and Isolation Panel [NHANES]    Frequency of Communication with Friends and Family:  More than three times a week    Frequency of Social Gatherings with Friends and Family: Three times a week    Attends Religious Services: More than 4 times per year    Active Member of Clubs or Organizations: No    Attends Engineer, structural: Not on file    Marital Status: Divorced  Catering manager Violence: Not on file   Family History  Problem Relation Age of Onset   Thyroid disease Mother    Hypertension Mother    Hypertension Father    Melanoma Paternal Aunt    Thyroid disease Maternal Grandmother    Hematuria Paternal Grandmother    Kidney failure Paternal Grandmother    Kidney disease Paternal Grandfather    Kidney cancer Neg Hx    Breast cancer Neg Hx    Ovarian cancer Neg Hx    Colon cancer Neg Hx      Review of Systems  Gastrointestinal:  Positive for abdominal pain.  All other systems reviewed and are negative.      Objective:   Physical Exam Vitals reviewed.  Constitutional:      General: She is not in acute distress.    Appearance: Normal appearance. She is normal weight. She is not ill-appearing, toxic-appearing or diaphoretic.  Cardiovascular:     Rate and Rhythm: Normal rate and regular rhythm.     Heart sounds: Normal heart sounds. No murmur heard.    No friction rub. No gallop.  Pulmonary:     Effort: Pulmonary effort is normal. No respiratory distress.     Breath sounds: Normal breath sounds. No stridor. No wheezing, rhonchi or rales.  Chest:    Abdominal:     General: Bowel sounds are normal. There is no distension.     Palpations: Abdomen is soft.     Tenderness: There is no abdominal tenderness. There is no guarding or rebound.     Hernia: No hernia is present.  Musculoskeletal:     Lumbar back: No deformity, spasms or tenderness. Normal range of motion. No scoliosis.       Back:  Neurological:     Mental Status: She is alert.           Assessment & Plan:  Attention deficit hyperactivity disorder (ADHD), predominantly  inattentive type  Screening cholesterol level - Plan: CBC with Differential/Platelet, COMPLETE METABOLIC PANEL WITH GFR, Lipid panel  Chronic midline low back pain without sciatica - Plan: DG Lumbar Spine Complete  Fatigue, unspecified type - Plan: TSH Start by obtaining an x-ray of the low back/lumbar spine to evaluate for spondylolisthesis.  Given her fatigue, I will also  check a TSH, CBC, and a CMP.  While checking lab work I will screen for hyperlipidemia with a fasting lipid panel.  I refilled her ADHD medication today.

## 2023-12-09 NOTE — Addendum Note (Signed)
 Addended by: Venia Carbon K on: 12/09/2023 03:02 PM   Modules accepted: Orders

## 2023-12-10 LAB — COMPLETE METABOLIC PANEL WITH GFR
AG Ratio: 1.7 (calc) (ref 1.0–2.5)
ALT: 15 U/L (ref 6–29)
AST: 16 U/L (ref 10–30)
Albumin: 4.5 g/dL (ref 3.6–5.1)
Alkaline phosphatase (APISO): 43 U/L (ref 31–125)
BUN: 15 mg/dL (ref 7–25)
CO2: 25 mmol/L (ref 20–32)
Calcium: 9.7 mg/dL (ref 8.6–10.2)
Chloride: 107 mmol/L (ref 98–110)
Creat: 0.65 mg/dL (ref 0.50–0.97)
Globulin: 2.7 g/dL (ref 1.9–3.7)
Glucose, Bld: 69 mg/dL (ref 65–99)
Potassium: 4.6 mmol/L (ref 3.5–5.3)
Sodium: 139 mmol/L (ref 135–146)
Total Bilirubin: 0.4 mg/dL (ref 0.2–1.2)
Total Protein: 7.2 g/dL (ref 6.1–8.1)
eGFR: 119 mL/min/{1.73_m2} (ref >=60)

## 2023-12-10 LAB — LIPID PANEL
Cholesterol: 157 mg/dL (ref <200)
HDL: 62 mg/dL (ref >=50)
LDL Cholesterol (Calc): 81 mg/dL
Non-HDL Cholesterol (Calc): 95 mg/dL (ref <130)
Total CHOL/HDL Ratio: 2.5 (calc) (ref <5.0)
Triglycerides: 67 mg/dL (ref <150)

## 2023-12-10 LAB — CBC WITH DIFFERENTIAL/PLATELET
Absolute Lymphocytes: 2202 {cells}/uL (ref 850–3900)
Absolute Monocytes: 502 {cells}/uL (ref 200–950)
Basophils Absolute: 43 {cells}/uL (ref 0–200)
Basophils Relative: 0.5 %
Eosinophils Absolute: 77 {cells}/uL (ref 15–500)
Eosinophils Relative: 0.9 %
HCT: 40.4 % (ref 35.0–45.0)
Hemoglobin: 13.1 g/dL (ref 11.7–15.5)
MCH: 29.6 pg (ref 27.0–33.0)
MCHC: 32.4 g/dL (ref 32.0–36.0)
MCV: 91.4 fL (ref 80.0–100.0)
MPV: 10.9 fL (ref 7.5–12.5)
Monocytes Relative: 5.9 %
Neutro Abs: 5678 {cells}/uL (ref 1500–7800)
Neutrophils Relative %: 66.8 %
Platelets: 280 10*3/uL (ref 140–400)
RBC: 4.42 10*6/uL (ref 3.80–5.10)
RDW: 11.5 % (ref 11.0–15.0)
Total Lymphocyte: 25.9 %
WBC: 8.5 10*3/uL (ref 3.8–10.8)

## 2023-12-10 LAB — TSH: TSH: 0.75 m[IU]/L

## 2023-12-13 LAB — TESTOSTERONE, TOTAL, LC/MS/MS: Testosterone, Total, LC-MS-MS: 14 ng/dL (ref 2–45)

## 2023-12-18 ENCOUNTER — Encounter: Payer: Self-pay | Admitting: Family Medicine

## 2024-01-07 ENCOUNTER — Other Ambulatory Visit: Payer: Self-pay | Admitting: Family Medicine

## 2024-01-07 MED ORDER — AMPHETAMINE-DEXTROAMPHETAMINE 20 MG PO TABS
20.0000 mg | ORAL_TABLET | Freq: Every day | ORAL | 0 refills | Status: DC
Start: 2024-01-07 — End: 2024-02-10

## 2024-01-09 ENCOUNTER — Encounter: Admitting: Family Medicine

## 2024-02-10 ENCOUNTER — Other Ambulatory Visit: Payer: Self-pay | Admitting: Family Medicine

## 2024-02-10 MED ORDER — AMPHETAMINE-DEXTROAMPHETAMINE 20 MG PO TABS: 20.0000 mg | ORAL_TABLET | Freq: Every day | ORAL | 0 refills | Status: DC

## 2024-03-12 ENCOUNTER — Encounter: Payer: Self-pay | Admitting: Family Medicine

## 2024-03-12 ENCOUNTER — Other Ambulatory Visit: Payer: Self-pay | Admitting: Family Medicine

## 2024-03-12 MED ORDER — AMPHETAMINE-DEXTROAMPHETAMINE 20 MG PO TABS: 20.0000 mg | ORAL_TABLET | Freq: Every day | ORAL | 0 refills | Status: DC

## 2024-03-12 NOTE — Telephone Encounter (Unsigned)
 Copied from CRM [id number]. Topic: Clinical - Medication Refill >> Mar 12, 2024 11:48 AM Everlene Hobby D wrote: Medication: amphetamine -dextroamphetamine  (ADDERALL) 20 MG tablet   Has the patient contacted their pharmacy? No (Agent: If no, request that the patient contact the pharmacy for the refill. If patient does not wish to contact the pharmacy document the reason why and proceed with request.) (Agent: If yes, when and what did the pharmacy advise?)  This is the patient's preferred pharmacy:  [id number] [place] - [address] [place] [address] Phone: [phone] Fax: [phone]  Is this the correct pharmacy for this prescription? Yes If no, delete pharmacy and type the correct one.   Has the prescription been filled recently? Yes  Is the patient out of the medication? Yes  Has the patient been seen for an appointment in the last year OR does the patient have an upcoming appointment? Yes  Can we respond through MyChart? Yes  Agent: Please be advised that Rx refills may take up to 3 business days. We ask that you follow-up with your pharmacy.

## 2024-03-13 NOTE — Telephone Encounter (Signed)
 Requested medications are due for refill today.  no  Requested medications are on the active medications list.  yes  Last refill. 03/12/2024  Future visit scheduled.     Notes to clinic.  Refusal not delegated.    Requested Prescriptions  Pending Prescriptions Disp Refills   amphetamine -dextroamphetamine  (ADDERALL) 20 MG tablet 30 tablet 0    Sig: Take 1 tablet (20 mg total) by mouth daily.     Not Delegated - Psychiatry:  Stimulants/ADHD Failed - 03/13/2024 11:52 AM      Failed - This refill cannot be delegated      Failed - Urine Drug Screen completed in last 360 days      Failed - Valid encounter within last 6 months    Recent Outpatient Visits           3 months ago Attention deficit hyperactivity disorder (ADHD), predominantly inattentive type   [facility] Austine Lefort, MD   5 months ago Bronchitis with wheezing   [facility] Amadeo June, MD   1 year ago Frequent UTI   [facility] [facility] Austine Lefort, MD   1 year ago Lipoma of left lower extremity   [facility] [facility] Austine Lefort, MD              Passed - Last BP in normal range    BP Readings from Last 1 Encounters:  12/09/23 120/82         Passed - Last Heart Rate in normal range    Pulse Readings from Last 1 Encounters:  12/09/23 99

## 2024-05-15 ENCOUNTER — Ambulatory Visit: Payer: Self-pay

## 2024-05-15 NOTE — Telephone Encounter (Signed)
 FYI Only or Action Required?: FYI only for provider.  Patient was last seen in primary care on 12/09/2023 by Duanne Butler DASEN, MD.  Called Nurse Triage reporting Dysuria.  Symptoms began several days ago.  Interventions attempted: OTC medications: ibuprofen  and trimeofrin 100 mg and Rest, hydration, or home remedies.  Symptoms are: gradually worsening.  Triage Disposition: See Physician Within 24 Hours  Patient/caregiver understands and will follow disposition?: Yes   Summary: Possible UTI, seeking relief, declined appt   Pt has a UTI and cannot come in for an appt, her insurance does not cover urgent care. Seeking relief prior to the weekend, wants to speak to a nurse.  Best contact: [phone]    Reason for Disposition  All other patients with painful urination  (Exception: [1] EITHER frequency or urgency AND [2] has on-call doctor.)  Answer Assessment - Initial Assessment Questions 1. SEVERITY: How bad is the pain?  (e.g., Scale 1-10; mild, moderate, or severe)     8/10 2. FREQUENCY: How many times have you had painful urination today?      Every times 3. PATTERN: Is pain present every time you urinate or just sometimes?      Every times 4. ONSET: When did the painful urination start?      4 days 5. FEVER: Do you have a fever? If Yes, ask: What is your temperature, how was it measured, and when did it start?     no 6. PAST UTI: Have you had a urine infection before? If Yes, ask: When was the last time? and What happened that time?      Been a while maybe more than 6 month 7. CAUSE: What do you think is causing the painful urination?  (e.g., UTI, scratch, Herpes sore)     uti 8. OTHER SYMPTOMS: Do you have any other symptoms? (e.g., blood in urine, flank pain, genital sores, urgency, vaginal discharge)     Urinary urgency, abd pain  Protocols used: Urination Pain - Female-A-AH

## 2024-05-17 ENCOUNTER — Other Ambulatory Visit: Payer: Self-pay | Admitting: Family Medicine

## 2024-05-17 MED ORDER — CEPHALEXIN 500 MG PO CAPS: 500.0000 mg | ORAL_CAPSULE | Freq: Three times a day (TID) | ORAL | 0 refills | Status: AC

## 2024-06-15 ENCOUNTER — Encounter: Payer: Self-pay | Admitting: Family Medicine

## 2024-06-15 ENCOUNTER — Other Ambulatory Visit: Payer: Self-pay

## 2024-06-15 MED ORDER — AMPHETAMINE-DEXTROAMPHETAMINE 20 MG PO TABS: 20.0000 mg | ORAL_TABLET | Freq: Every day | ORAL | 0 refills | Status: DC

## 2024-09-09 ENCOUNTER — Encounter: Payer: Self-pay | Admitting: Family Medicine

## 2024-09-10 ENCOUNTER — Other Ambulatory Visit: Payer: Self-pay | Admitting: Family Medicine

## 2024-09-10 MED ORDER — AMPHETAMINE-DEXTROAMPHETAMINE 20 MG PO TABS: 20.0000 mg | ORAL_TABLET | Freq: Every day | ORAL | 0 refills | Status: AC

## 2024-11-04 ENCOUNTER — Encounter: Payer: Self-pay | Admitting: Family Medicine

## 2024-11-04 ENCOUNTER — Ambulatory Visit: Payer: Self-pay

## 2024-11-04 ENCOUNTER — Telehealth: Admitting: Family Medicine

## 2024-11-04 DIAGNOSIS — J069 Acute upper respiratory infection, unspecified: Secondary | ICD-10-CM

## 2024-11-04 DIAGNOSIS — Z20828 Contact with and (suspected) exposure to other viral communicable diseases: Secondary | ICD-10-CM

## 2024-11-04 MED ORDER — OSELTAMIVIR PHOSPHATE 75 MG PO CAPS: 75.0000 mg | ORAL_CAPSULE | Freq: Two times a day (BID) | ORAL | 0 refills | Status: AC

## 2024-11-04 NOTE — Telephone Encounter (Signed)
 FYI Only or Action Required?: FYI only for provider: appointment scheduled on 11/04/24.  Patient was last seen in primary care on 12/09/2023 by Duanne Butler DASEN, MD.  Called Nurse Triage reporting Nasal Congestion, Fatigue, Vomiting, and Fever.  Symptoms began yesterday.  Interventions attempted: Nothing.  Symptoms are: gradually worsening.  Triage Disposition: See Physician Within 24 Hours (overriding Home Care)  Patient/caregiver understands and will follow disposition?: Yes                Message from [facility] S sent at 11/04/2024  7:43 AM EST  Reason for Triage: fever, fatigue, vomiting, congestion   Reason for Disposition  [1] Probable influenza (fever) with no complications AND [2] NOT HIGH RISK  Answer Assessment - Initial Assessment Questions No difficulty breathing, neck stiffness, chest pain.  1. SYMPTOMS: What is your main symptom or concern? (e.g., cough, fever, shortness of breath, muscle aches)     Fever, vomiting.  2. ONSET: When did the symptoms start?      Yesterday.  3. COUGH: Do you have a cough? If Yes, ask: How bad is the cough?       Yes, productive and occasional.  4. FEVER: Do you have a fever? If Yes, ask: What is your temperature, how was it measured, and when did it start?     101.4 this AM, oral thermometer  5. BREATHING DIFFICULTY: Are you having any difficulty breathing? (e.g., normal; shortness of breath, wheezing, unable to speak)      No.  6. BETTER-SAME-WORSE: Are you getting better, staying the same or getting worse compared to yesterday?  If getting worse, ask, In what way?     N/A.  7. OTHER SYMPTOMS: Do you have any other symptoms?  (e.g., chills, fatigue, headache, loss of smell or taste, muscle pain, sore throat)     Nasal congestion, sounds stuffy Fatigue, went to bed early Vomited on her way to work (once) Chills  8. INFLUENZA EXPOSURE: Was there any known exposure to influenza (flu) before the  symptoms began?      Yes.  9. INFLUENZA SUSPECTED: Why do you think you have influenza? (e.g., positive flu self-test at home, symptoms after exposure).     Symptoms after exposure. 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  10. INFLUENZA VACCINE: Have you had the flu vaccine? If Yes, ask: When did you last get it?       Yes.  11. HIGH RISK FOR COMPLICATIONS: Do you have any chronic medical problems? (e.g., asthma, heart or lung disease, obesity, weak immune system)       No.  12. PREGNANCY: Is there any chance you are pregnant? When was your last menstrual period?       10/08/24.  13. O2 SATURATION MONITOR:  Do you use an oxygen saturation monitor (pulse oximeter) at home? If Yes, ask What is your reading (oxygen level) today? What is your usual oxygen saturation reading? (e.g., 95%)       No.  Protocols used: Influenza (Flu) Suspected-A-AH

## 2024-11-04 NOTE — Patient Instructions (Signed)
 Your symptoms and exam findings are most consistent with a viral upper respiratory infection. These usually run their course in 5-7 days. Unfortunately, antibiotics don't work against viruses and just increase your risk of other issues such as diarrhea, yeast infections, and resistant infections.  If your symptoms last longer than 10 days and/or you start feeling worse with facial pain, high fever, cough, shortness of breath or start feeling significantly worse, please call us right away to be further evaluated.  Some things that can make you feel better are: - Increased rest - Increasing fluid with water/sugar free electrolytes - Acetaminophen as needed for fever/pain.  - Salt water gargling, chloraseptic spray and throat lozenges - OTC guaifenesin (Mucinex).  - Saline sinus flushes or a neti pot.  - Humidifying the air.

## 2024-11-04 NOTE — Progress Notes (Signed)
 "                    MyChart Video Visit    Virtual Visit via Video Note   This format is felt to be most appropriate for this patient at this time. Physical exam was limited by quality of the video and audio technology used for the visit.    Patient location: home Provider location: [facility] Persons involved in the visit: patient, provider Connected at 1405, required telephone connection due to patient unable to unmute her phone.  I discussed the limitations of evaluation and management by telemedicine and the availability of in person appointments. The patient expressed understanding and agreed to proceed.  Patient: Susan Cooper   DOB: [birth year]   34 y.o. Female  MRN: [id number] Visit Date: 11/04/2024  Today's healthcare provider: Jeoffrey GORMAN Barrio, FNP   No chief complaint on file.   Subjective:    HPI  Discussed the use of AI scribe software for clinical note transcription with the patient, who gave verbal consent to proceed.  History of Present Illness Susan Cooper is a 34 year old female who presents with fever, congestion, and fatigue.  She typically goes to bed late, but last night she went to bed early around 8:00 to 8:30 PM due to feeling unusually tired. Upon waking this morning, she felt unwell and vomited once on her way to work. Her temperature was measured at 101.72F.  She describes symptoms of congestion, chills, body aches, and fatigue, which began last night with a cough and worsened by this morning. She has been sleeping most of the morning due to these symptoms.  She has not taken a flu or COVID test yet. She mentions exposure to coworkers who recently had the flu, with one coworker still symptomatic yesterday.  She has taken Robitussin for cough and congestion, and she is currently on Adderall. She does not recall taking Tamiflu  in the past.  No shortness of breath.  Past Medical History:  Diagnosis Date   ADHD     Chronic kidney disease    Kidney stone    Urinary tract infection    Past Surgical History:  Procedure Laterality Date   lipoma Left    left leg   WISDOM TOOTH EXTRACTION     Medications Ordered Prior to Encounter[1] Allergies[2]   Review of Systems  All other systems reviewed and are negative.        Objective:    There were no vitals taken for this visit.      Physical Exam Constitutional:      General: She is not in acute distress.    Appearance: Normal appearance. She is not toxic-appearing.  HENT:     Nose: Congestion present.  Eyes:     Conjunctiva/sclera: Conjunctivae normal.  Pulmonary:     Effort: Pulmonary effort is normal. No respiratory distress.  Skin:    Coloration: Skin is not pale.  Neurological:     General: No focal deficit present.     Mental Status: She is alert and oriented to person, place, and time.  Psychiatric:        Mood and Affect: Mood normal.        Behavior: Behavior normal.        Thought Content: Thought content normal.        Judgment: Judgment normal.         Assessment & Plan:    Problem List  Items Addressed This Visit       Respiratory   Viral URI - Primary   Relevant Medications   oseltamivir  (TAMIFLU ) 75 MG capsule   Other Visit Diagnoses       Exposure to the flu           Assessment and Plan Assessment & Plan Suspected influenza infection Acute onset of fever, congestion, cough, fatigue, and vomiting after exposure to coworkers with influenza. Likely viral etiology, probable influenza. No high-risk factors for severe illness. Antibiotics not indicated unless bacterial infection suspected. - Prescribed Tamiflu  twice daily for five days, contingent on positive flu test result. - Advised obtaining a flu and COVID swab before starting Tamiflu . - Recommended Tylenol for fever and body aches. - Suggested Flonase for nasal congestion. - Recommended Mucinex for chest and sinus congestion. - Advised  hydration and rest. - Instructed to monitor for persistent fevers, shortness of breath, severe symptoms, or signs of sinus infection, and to contact if these occur. - Sent prescription to [facility]. - Provided doctor's note via MyChart.    Meds ordered this encounter  Medications   oseltamivir  (TAMIFLU ) 75 MG capsule    Sig: Take 1 capsule (75 mg total) by mouth 2 (two) times daily.    Dispense:  10 capsule    Refill:  0    Supervising Provider:   DUANNE LOWERS T [[id number]]     Return if symptoms worsen or fail to improve.     I discussed the assessment and treatment plan with the patient. The patient was provided an opportunity to ask questions and all were answered. The patient agreed with the plan and demonstrated an understanding of the instructions.   The patient was advised to call back or seek an in-person evaluation if the symptoms worsen or if the condition fails to improve as anticipated.  I provided 12 minutes of non-face-to-face time during this encounter.  Jeoffrey GORMAN Barrio, FNP [facility]       [1]  Current Outpatient Medications on File Prior to Visit  Medication Sig Dispense Refill   albuterol  (VENTOLIN  HFA) 108 (90 Base) MCG/ACT inhaler TAKE 2 PUFFS BY MOUTH EVERY 6 HOURS AS NEEDED FOR WHEEZE OR SHORTNESS OF BREATH 8.5 each 0   amphetamine -dextroamphetamine  (ADDERALL) 20 MG tablet Take 1 tablet (20 mg total) by mouth daily. 90 tablet 0   cephALEXin  (KEFLEX ) 500 MG capsule Take 1 capsule (500 mg total) by mouth 3 (three) times daily. 21 capsule 0   spironolactone (ALDACTONE) 25 MG tablet Take 25 mg by mouth daily.     tretinoin  (RETIN-A ) 0.05 % cream Apply topically at bedtime. 45 g 5   No current facility-administered medications on file prior to visit.  [2] No Known Allergies  "
